# Patient Record
Sex: Female | Born: 1955 | Hispanic: Yes | Marital: Single | Smoking: Never smoker
Health system: Southern US, Community
[De-identification: ages and names within clinical notes are randomized; demographics above are authoritative.]

## PROBLEM LIST (undated history)

## (undated) DIAGNOSIS — E079 Disorder of thyroid, unspecified: Secondary | ICD-10-CM

## (undated) DIAGNOSIS — I10 Essential (primary) hypertension: Secondary | ICD-10-CM

## (undated) HISTORY — PX: HYSTERECTOMY ABDOMINAL WITH SALPINGECTOMY: SHX6725

---

## 2020-02-13 ENCOUNTER — Ambulatory Visit (INDEPENDENT_AMBULATORY_CARE_PROVIDER_SITE_OTHER)
Admission: EM | Admit: 2020-02-13 | Discharge: 2020-02-13 | Disposition: A | Discharge status: Home / Self Care | Payer: 59 | Source: Home / Self Care

## 2020-02-13 ENCOUNTER — Other Ambulatory Visit: Payer: Self-pay

## 2020-02-13 ENCOUNTER — Encounter (HOSPITAL_COMMUNITY): Payer: Self-pay

## 2020-02-13 DIAGNOSIS — Z1152 Encounter for screening for COVID-19: Secondary | ICD-10-CM

## 2020-02-13 DIAGNOSIS — U071 COVID-19: Secondary | ICD-10-CM | POA: Insufficient documentation

## 2020-02-13 HISTORY — DX: Disorder of thyroid, unspecified: E07.9

## 2020-02-13 HISTORY — DX: Essential (primary) hypertension: I10

## 2020-02-13 NOTE — ED Triage Notes (Signed)
Patient needs COVID testing for travel purposes.

## 2020-02-13 NOTE — ED Provider Notes (Signed)
  [facility]   MRN: [id number] DOB: 1956-03-30  Subjective:   Misty Pruitt is a 64 y.o. female presenting for COVID-19 testing.  Patient is traveling to [place].  Denies any symptoms.  No current facility-administered medications for this encounter. No current outpatient medications on file.   No Known Allergies  Past Medical History:  Diagnosis Date  . Hypertension   . Thyroid disease       No family history on file.  Social History   Tobacco Use  . Smoking status: Never Smoker  . Smokeless tobacco: Never Used  Substance Use Topics  . Alcohol use: Not on file  . Drug use: Not on file    Review of Systems  Constitutional: Negative for fever and malaise/fatigue.  HENT: Negative for congestion, ear pain, sinus pain and sore throat.   Eyes: Negative for discharge and redness.  Respiratory: Negative for cough, hemoptysis, shortness of breath and wheezing.   Cardiovascular: Negative for chest pain.  Gastrointestinal: Negative for abdominal pain, diarrhea, nausea and vomiting.  Genitourinary: Negative for dysuria, flank pain and hematuria.  Musculoskeletal: Negative for myalgias.  Skin: Negative for rash.  Neurological: Negative for dizziness, weakness and headaches.  Psychiatric/Behavioral: Negative for depression and substance abuse.     Objective:   Vitals: BP 140/65 (BP Location: Right Arm)   Pulse 93   Temp 97.8 F (36.6 C) (Oral)   Resp 14   SpO2 99%   Physical Exam Constitutional:      General: She is not in acute distress.    Appearance: Normal appearance. She is well-developed. She is not ill-appearing, toxic-appearing or diaphoretic.  HENT:     Head: Normocephalic and atraumatic.     Nose: Nose normal.     Mouth/Throat:     Mouth: Mucous membranes are moist.  Eyes:     Extraocular Movements: Extraocular movements intact.     Pupils: Pupils are equal, round, and reactive to light.  Cardiovascular:     Rate and Rhythm: Normal rate  and regular rhythm.     Pulses: Normal pulses.     Heart sounds: Normal heart sounds. No murmur. No friction rub. No gallop.   Pulmonary:     Effort: Pulmonary effort is normal. No respiratory distress.     Breath sounds: Normal breath sounds. No stridor. No wheezing, rhonchi or rales.  Skin:    General: Skin is warm and dry.     Findings: No rash.  Neurological:     Mental Status: She is alert and oriented to person, place, and time.  Psychiatric:        Mood and Affect: Mood normal.        Behavior: Behavior normal.        Thought Content: Thought content normal.     Assessment and Plan :   1. Encounter for screening for COVID-19     Counseled patient on nature of COVID-19 including modes of transmission, diagnostic testing, management and supportive care.  Counseled on medications used for symptomatic relief. COVID 19 testing is pending. Counseled patient on potential for adverse effects with medications prescribed/recommended today, ER and return-to-clinic precautions discussed, patient verbalized understanding.     Wallis Bamberg, PA-C 02/13/20 1755

## 2020-02-14 LAB — SARS CORONAVIRUS 2 (TAT 6-24 HRS): SARS Coronavirus 2: POSITIVE — AB

## 2020-02-15 ENCOUNTER — Other Ambulatory Visit: Payer: Self-pay

## 2020-02-15 ENCOUNTER — Encounter (HOSPITAL_COMMUNITY): Payer: Self-pay | Admitting: *Deleted

## 2020-02-15 ENCOUNTER — Inpatient Hospital Stay (HOSPITAL_COMMUNITY)
Admission: EM | Admit: 2020-02-15 | Discharge: 2020-02-21 | DRG: 177 | Disposition: A | Discharge status: Home / Self Care | Payer: 59 | Attending: Internal Medicine | Admitting: Internal Medicine

## 2020-02-15 DIAGNOSIS — U071 COVID-19: Principal | ICD-10-CM | POA: Diagnosis present

## 2020-02-15 DIAGNOSIS — Z79890 Hormone replacement therapy (postmenopausal): Secondary | ICD-10-CM

## 2020-02-15 DIAGNOSIS — I10 Essential (primary) hypertension: Secondary | ICD-10-CM | POA: Diagnosis present

## 2020-02-15 DIAGNOSIS — R0902 Hypoxemia: Secondary | ICD-10-CM | POA: Diagnosis not present

## 2020-02-15 DIAGNOSIS — Z79899 Other long term (current) drug therapy: Secondary | ICD-10-CM

## 2020-02-15 DIAGNOSIS — Z7982 Long term (current) use of aspirin: Secondary | ICD-10-CM

## 2020-02-15 DIAGNOSIS — I959 Hypotension, unspecified: Secondary | ICD-10-CM | POA: Diagnosis present

## 2020-02-15 DIAGNOSIS — R197 Diarrhea, unspecified: Secondary | ICD-10-CM | POA: Diagnosis present

## 2020-02-15 DIAGNOSIS — J9600 Acute respiratory failure, unspecified whether with hypoxia or hypercapnia: Secondary | ICD-10-CM | POA: Diagnosis present

## 2020-02-15 DIAGNOSIS — D649 Anemia, unspecified: Secondary | ICD-10-CM | POA: Diagnosis present

## 2020-02-15 DIAGNOSIS — E669 Obesity, unspecified: Secondary | ICD-10-CM | POA: Diagnosis present

## 2020-02-15 DIAGNOSIS — Z6830 Body mass index (BMI) 30.0-30.9, adult: Secondary | ICD-10-CM

## 2020-02-15 DIAGNOSIS — E039 Hypothyroidism, unspecified: Secondary | ICD-10-CM | POA: Diagnosis present

## 2020-02-15 DIAGNOSIS — J1282 Pneumonia due to coronavirus disease 2019: Secondary | ICD-10-CM | POA: Diagnosis present

## 2020-02-15 DIAGNOSIS — J9601 Acute respiratory failure with hypoxia: Secondary | ICD-10-CM | POA: Diagnosis present

## 2020-02-15 LAB — CBC WITH DIFFERENTIAL/PLATELET
Abs Immature Granulocytes: 0.04 10*3/uL (ref 0.00–0.07)
Basophils Absolute: 0 10*3/uL (ref 0.0–0.1)
Basophils Relative: 0 %
Eosinophils Absolute: 0 10*3/uL (ref 0.0–0.5)
Eosinophils Relative: 0 %
HCT: 38.2 % (ref 36.0–46.0)
Hemoglobin: 11.8 g/dL — ABNORMAL LOW (ref 12.0–15.0)
Immature Granulocytes: 0 %
Lymphocytes Relative: 14 %
Lymphs Abs: 1.3 10*3/uL (ref 0.7–4.0)
MCH: 26.3 pg (ref 26.0–34.0)
MCHC: 30.9 g/dL (ref 30.0–36.0)
MCV: 85.1 fL (ref 80.0–100.0)
Monocytes Absolute: 0.7 10*3/uL (ref 0.1–1.0)
Monocytes Relative: 8 %
Neutro Abs: 7.1 10*3/uL (ref 1.7–7.7)
Neutrophils Relative %: 78 %
Platelets: 240 10*3/uL (ref 150–400)
RBC: 4.49 MIL/uL (ref 3.87–5.11)
RDW: 14.6 % (ref 11.5–15.5)
WBC: 9.2 10*3/uL (ref 4.0–10.5)
nRBC: 0 % (ref 0.0–0.2)

## 2020-02-15 MED ORDER — ACETAMINOPHEN 325 MG PO TABS: 650.0000 mg | ORAL_TABLET | Freq: Once | ORAL | Status: DC | PRN

## 2020-02-15 NOTE — ED Triage Notes (Signed)
To ED for further eval of sob and cough. Pt dx with covid 2 wks ago. Pt alert and talking in complete sentences. Temp 102. Last tylenol last pm. Will give some in triage

## 2020-02-16 ENCOUNTER — Emergency Department (HOSPITAL_COMMUNITY): Payer: 59

## 2020-02-16 ENCOUNTER — Other Ambulatory Visit: Payer: Self-pay

## 2020-02-16 ENCOUNTER — Encounter (HOSPITAL_COMMUNITY): Payer: Self-pay | Admitting: Internal Medicine

## 2020-02-16 DIAGNOSIS — I10 Essential (primary) hypertension: Secondary | ICD-10-CM | POA: Diagnosis present

## 2020-02-16 DIAGNOSIS — R0902 Hypoxemia: Secondary | ICD-10-CM | POA: Diagnosis present

## 2020-02-16 DIAGNOSIS — E669 Obesity, unspecified: Secondary | ICD-10-CM | POA: Diagnosis present

## 2020-02-16 DIAGNOSIS — J9600 Acute respiratory failure, unspecified whether with hypoxia or hypercapnia: Secondary | ICD-10-CM | POA: Diagnosis present

## 2020-02-16 DIAGNOSIS — I959 Hypotension, unspecified: Secondary | ICD-10-CM | POA: Diagnosis present

## 2020-02-16 DIAGNOSIS — R197 Diarrhea, unspecified: Secondary | ICD-10-CM | POA: Diagnosis present

## 2020-02-16 DIAGNOSIS — D649 Anemia, unspecified: Secondary | ICD-10-CM | POA: Diagnosis present

## 2020-02-16 DIAGNOSIS — Z7982 Long term (current) use of aspirin: Secondary | ICD-10-CM | POA: Diagnosis not present

## 2020-02-16 DIAGNOSIS — Z79890 Hormone replacement therapy (postmenopausal): Secondary | ICD-10-CM | POA: Diagnosis not present

## 2020-02-16 DIAGNOSIS — J9601 Acute respiratory failure with hypoxia: Secondary | ICD-10-CM | POA: Diagnosis present

## 2020-02-16 DIAGNOSIS — Z79899 Other long term (current) drug therapy: Secondary | ICD-10-CM | POA: Diagnosis not present

## 2020-02-16 DIAGNOSIS — U071 COVID-19: Principal | ICD-10-CM

## 2020-02-16 DIAGNOSIS — E039 Hypothyroidism, unspecified: Secondary | ICD-10-CM | POA: Diagnosis present

## 2020-02-16 DIAGNOSIS — Z6830 Body mass index (BMI) 30.0-30.9, adult: Secondary | ICD-10-CM | POA: Diagnosis not present

## 2020-02-16 DIAGNOSIS — J1282 Pneumonia due to coronavirus disease 2019: Secondary | ICD-10-CM | POA: Diagnosis present

## 2020-02-16 LAB — COMPREHENSIVE METABOLIC PANEL
ALT: 19 U/L (ref 0–44)
AST: 28 U/L (ref 15–41)
Albumin: 3.7 g/dL (ref 3.5–5.0)
Alkaline Phosphatase: 63 U/L (ref 38–126)
Anion gap: 11 (ref 5–15)
BUN: 11 mg/dL (ref 8–23)
CO2: 25 mmol/L (ref 22–32)
Calcium: 8.5 mg/dL — ABNORMAL LOW (ref 8.9–10.3)
Chloride: 99 mmol/L (ref 98–111)
Creatinine, Ser: 0.95 mg/dL (ref 0.44–1.00)
GFR calc Af Amer: >60 mL/min (ref >60)
GFR calc non Af Amer: >60 mL/min (ref >60)
Glucose, Bld: 116 mg/dL — ABNORMAL HIGH (ref 70–99)
Potassium: 3.8 mmol/L (ref 3.5–5.1)
Sodium: 135 mmol/L (ref 135–145)
Total Bilirubin: 0.4 mg/dL (ref 0.3–1.2)
Total Protein: 7.4 g/dL (ref 6.5–8.1)

## 2020-02-16 LAB — CBC
HCT: 36.3 % (ref 36.0–46.0)
Hemoglobin: 11.5 g/dL — ABNORMAL LOW (ref 12.0–15.0)
MCH: 26.9 pg (ref 26.0–34.0)
MCHC: 31.7 g/dL (ref 30.0–36.0)
MCV: 84.8 fL (ref 80.0–100.0)
Platelets: 225 10*3/uL (ref 150–400)
RBC: 4.28 MIL/uL (ref 3.87–5.11)
RDW: 14.8 % (ref 11.5–15.5)
WBC: 8.8 10*3/uL (ref 4.0–10.5)
nRBC: 0 % (ref 0.0–0.2)

## 2020-02-16 LAB — PROCALCITONIN: Procalcitonin: <0.1 ng/mL

## 2020-02-16 LAB — D-DIMER, QUANTITATIVE: D-Dimer, Quant: 0.5 ug/mL-FEU (ref 0.00–0.50)

## 2020-02-16 LAB — C-REACTIVE PROTEIN: CRP: 8.8 mg/dL — ABNORMAL HIGH (ref <1.0)

## 2020-02-16 LAB — CREATININE, SERUM
Creatinine, Ser: 0.8 mg/dL (ref 0.44–1.00)
GFR calc Af Amer: >60 mL/min (ref >60)
GFR calc non Af Amer: >60 mL/min (ref >60)

## 2020-02-16 LAB — FERRITIN: Ferritin: 142 ng/mL (ref 11–307)

## 2020-02-16 LAB — HIV ANTIBODY (ROUTINE TESTING W REFLEX): HIV Screen 4th Generation wRfx: NONREACTIVE

## 2020-02-16 LAB — ABO/RH: ABO/RH(D): AB POS

## 2020-02-16 LAB — TROPONIN I (HIGH SENSITIVITY)
Troponin I (High Sensitivity): 4 ng/L (ref <18)
Troponin I (High Sensitivity): 5 ng/L (ref <18)

## 2020-02-16 MED ORDER — ALBUTEROL SULFATE HFA 108 (90 BASE) MCG/ACT IN AERS
2.0000 | INHALATION_SPRAY | Freq: Once | RESPIRATORY_TRACT | Status: AC
  Administered 2020-02-16: 2 via RESPIRATORY_TRACT
  Filled 2020-02-16: qty 6.7

## 2020-02-16 MED ORDER — LEVOTHYROXINE SODIUM 50 MCG PO TABS
50.0000 ug | ORAL_TABLET | Freq: Every day | ORAL | Status: DC
  Administered 2020-02-16 – 2020-02-21 (×6): 50 ug via ORAL
  Filled 2020-02-16 (×6): qty 1

## 2020-02-16 MED ORDER — ONDANSETRON HCL 4 MG/2ML IJ SOLN: 4.0000 mg | Freq: Four times a day (QID) | INTRAMUSCULAR | Status: DC | PRN

## 2020-02-16 MED ORDER — SODIUM CHLORIDE 0.9 % IV SOLN
100.0000 mg | Freq: Every day | INTRAVENOUS | Status: AC
  Administered 2020-02-17 – 2020-02-20 (×4): 100 mg via INTRAVENOUS
  Filled 2020-02-16 (×4): qty 20

## 2020-02-16 MED ORDER — HYDROCOD POLST-CPM POLST ER 10-8 MG/5ML PO SUER
5.0000 mL | Freq: Once | ORAL | Status: AC
  Administered 2020-02-16: 5 mL via ORAL
  Filled 2020-02-16: qty 5

## 2020-02-16 MED ORDER — SODIUM CHLORIDE 0.9 % IV SOLN
100.0000 mg | INTRAVENOUS | Status: AC
  Administered 2020-02-16: 100 mg via INTRAVENOUS
  Filled 2020-02-16 (×2): qty 20

## 2020-02-16 MED ORDER — HYDROCOD POLST-CPM POLST ER 10-8 MG/5ML PO SUER
5.0000 mL | Freq: Two times a day (BID) | ORAL | Status: DC | PRN
  Administered 2020-02-17 – 2020-02-20 (×5): 5 mL via ORAL
  Filled 2020-02-16 (×6): qty 5

## 2020-02-16 MED ORDER — ONDANSETRON HCL 4 MG PO TABS
4.0000 mg | ORAL_TABLET | Freq: Four times a day (QID) | ORAL | Status: DC | PRN
  Administered 2020-02-19: 4 mg via ORAL
  Filled 2020-02-16: qty 1

## 2020-02-16 MED ORDER — DEXAMETHASONE SODIUM PHOSPHATE 10 MG/ML IJ SOLN
10.0000 mg | Freq: Once | INTRAMUSCULAR | Status: AC
  Administered 2020-02-16: 10 mg via INTRAVENOUS
  Filled 2020-02-16: qty 1

## 2020-02-16 MED ORDER — FAMOTIDINE 20 MG PO TABS
20.0000 mg | ORAL_TABLET | Freq: Every day | ORAL | Status: DC
  Administered 2020-02-16 – 2020-02-20 (×5): 20 mg via ORAL
  Filled 2020-02-16 (×5): qty 1

## 2020-02-16 MED ORDER — DEXAMETHASONE SODIUM PHOSPHATE 10 MG/ML IJ SOLN
6.0000 mg | INTRAMUSCULAR | Status: DC
  Administered 2020-02-17 – 2020-02-20 (×4): 6 mg via INTRAVENOUS
  Filled 2020-02-16 (×4): qty 1

## 2020-02-16 MED ORDER — LISINOPRIL 40 MG PO TABS
40.0000 mg | ORAL_TABLET | Freq: Every day | ORAL | Status: DC
  Administered 2020-02-16 – 2020-02-21 (×6): 40 mg via ORAL
  Filled 2020-02-16: qty 2
  Filled 2020-02-16 (×5): qty 1

## 2020-02-16 MED ORDER — SODIUM CHLORIDE 0.9 % IV SOLN: 100.0000 mg | Freq: Every day | INTRAVENOUS | Status: DC

## 2020-02-16 MED ORDER — SODIUM CHLORIDE 0.9 % IV SOLN: 200.0000 mg | Freq: Once | INTRAVENOUS | Status: DC

## 2020-02-16 MED ORDER — FLUOXETINE HCL 20 MG PO CAPS: 40.0000 mg | ORAL_CAPSULE | Freq: Every day | ORAL | Status: DC | PRN

## 2020-02-16 MED ORDER — ASPIRIN EC 81 MG PO TBEC
81.0000 mg | DELAYED_RELEASE_TABLET | ORAL | Status: DC
  Administered 2020-02-16 – 2020-02-21 (×3): 81 mg via ORAL
  Filled 2020-02-16 (×2): qty 1

## 2020-02-16 MED ORDER — ENOXAPARIN SODIUM 40 MG/0.4ML ~~LOC~~ SOLN
40.0000 mg | Freq: Every day | SUBCUTANEOUS | Status: DC
  Administered 2020-02-16 – 2020-02-21 (×6): 40 mg via SUBCUTANEOUS
  Filled 2020-02-16 (×6): qty 0.4

## 2020-02-16 MED ORDER — GUAIFENESIN-DM 100-10 MG/5ML PO SYRP
5.0000 mL | ORAL_SOLUTION | ORAL | Status: DC | PRN
  Administered 2020-02-16 – 2020-02-20 (×6): 5 mL via ORAL
  Filled 2020-02-16 (×6): qty 5

## 2020-02-16 NOTE — Plan of Care (Signed)
  Problem: Coping: Goal: Psychosocial and spiritual needs will be supported Outcome: Progressing   Problem: Respiratory: Goal: Will maintain a patent airway Outcome: Progressing   Problem: Respiratory: Goal: Complications related to the disease process, condition or treatment will be avoided or minimized Outcome: Progressing   

## 2020-02-16 NOTE — ED Provider Notes (Signed)
[facility] EMERGENCY DEPARTMENT Provider Note   CSN: [id number] Arrival date & time: 02/15/20  0867     History Chief Complaint  Patient presents with  . Cough  . Shortness of Breath    Misty Pruitt is a 64 y.o. female.  Patient to ED with cough, SOB, fever, nausea, body aches with known diagnosis of COVID 02/13/20. She has been able to eat and drink, no decrease in urination. She reports symptoms at the time of COVID test previously were "I felt yucky" but no SOB, known fever or significant cough. She has a history of HTN, Thyroid disease.   The history is provided by the patient. No language interpreter was used.  Cough Associated symptoms: chills, fever, myalgias and shortness of breath   Associated symptoms: no chest pain, no headaches and no sore throat   Shortness of Breath Associated symptoms: cough and fever   Associated symptoms: no abdominal pain, no chest pain, no headaches and no sore throat        Past Medical History:  Diagnosis Date  . Hypertension   . Thyroid disease     There are no problems to display for this patient.   Past Surgical History:  Procedure Laterality Date  . HYSTERECTOMY ABDOMINAL WITH SALPINGECTOMY       OB History   No obstetric history on file.     No family history on file.  Social History   Tobacco Use  . Smoking status: Never Smoker  . Smokeless tobacco: Never Used  Substance Use Topics  . Alcohol use: Not on file  . Drug use: Not on file    Home Medications Prior to Admission medications   Not on File    Allergies    Patient has no known allergies.  Review of Systems   Review of Systems  Constitutional: Positive for chills, fatigue and fever.  HENT: Positive for congestion. Negative for sore throat.   Respiratory: Positive for cough and shortness of breath.   Cardiovascular: Negative for chest pain.  Gastrointestinal: Negative for abdominal pain and nausea.  Genitourinary: Negative.     Musculoskeletal: Positive for myalgias.  Neurological: Negative for headaches.    Physical Exam Updated Vital Signs BP 118/75   Pulse 80   Temp 99.4 F (37.4 C) (Oral)   Resp 20   Ht 5\' 5"  (1.651 m)   Wt 83.5 kg   SpO2 98%   BMI 30.62 kg/m   Physical Exam Vitals and nursing note reviewed.  Constitutional:      Appearance: She is well-developed.  HENT:     Head: Normocephalic.  Cardiovascular:     Rate and Rhythm: Normal rate and regular rhythm.     Heart sounds: No murmur.  Pulmonary:     Effort: Pulmonary effort is normal.     Breath sounds: Examination of the right-lower field reveals rales. Examination of the left-lower field reveals rales. Rales present. No wheezing or rhonchi.  Abdominal:     General: Bowel sounds are normal.     Palpations: Abdomen is soft.     Tenderness: There is no abdominal tenderness. There is no guarding or rebound.  Musculoskeletal:        General: Normal range of motion.     Cervical back: Normal range of motion and neck supple.     Right lower leg: No edema.     Left lower leg: No edema.  Skin:    General: Skin is warm and dry.  Findings: No rash.  Neurological:     Mental Status: She is alert and oriented to person, place, and time.     ED Results / Procedures / Treatments   Labs (all labs ordered are listed, but only abnormal results are displayed) Labs Reviewed  CBC WITH DIFFERENTIAL/PLATELET - Abnormal; Notable for the following components:      Result Value   Hemoglobin 11.8 (*)    All other components within normal limits  COMPREHENSIVE METABOLIC PANEL    EKG None  Radiology No results found.  Procedures Procedures (including critical care time)  Medications Ordered in ED Medications  acetaminophen (TYLENOL) tablet 650 mg (has no administration in time range)  albuterol (VENTOLIN HFA) 108 (90 Base) MCG/ACT inhaler 2 puff (has no administration in time range)  chlorpheniramine-HYDROcodone (TUSSIONEX) 10-8  MG/5ML suspension 5 mL (has no administration in time range)    ED Course  I have reviewed the triage vital signs and the nursing notes.  Pertinent labs & imaging results that were available during my care of the patient were reviewed by me and considered in my medical decision making (see chart for details).    MDM Rules/Calculators/A&P                      Patient to ED with symptoms of SOB, fever, fatigue that started today. Dx COVID 02/13/20.  She is overall well appearing. She is actively coughing on exam. Albuterol inhaler and Tussionex provided with improvement in cough. CXR c/w COVID. She ambulates with normal O2 saturation, however, she becomes diaphoretic, lightheaded and coughing recurs. Recheck blood pressure 68 systolic.   On re-exam, monitored oxygenation is between 88-91%. Decadron ordered. Patient is felt to require admission for further management/treatment.   Final Clinical Impression(s) / ED Diagnoses Final diagnoses:  None   1. COVID 2. Hypotension 3. Hypoxia   Rx / DC Orders ED Discharge Orders    None       Elpidio Anis, PA-C 02/16/20 0241    Ward, Layla Maw, DO 02/16/20 [phone]

## 2020-02-16 NOTE — ED Notes (Signed)
Tele

## 2020-02-16 NOTE — ED Notes (Signed)
Admitting at bedside 

## 2020-02-16 NOTE — ED Notes (Signed)
Pt assisted to and from commode.

## 2020-02-16 NOTE — ED Notes (Signed)
PT states she feels better and wants to go home. I contacted the admitting MD, Dr. Randol Kern. He advised to ambulate the patient and monitor her oxygen saturation.

## 2020-02-16 NOTE — Progress Notes (Deleted)
Patient scheduled for outpatient Remdesivir infusion at 10:00 AM Saturday 4/10, Sunday 4/11, Monday 4/12 and Tuesday 4/13.  Please advise them to report to [facility] at [address]  Drive to the security guard and tell them you are here for an infusion. They will direct you to the front entrance where we will come and get you.  For questions call [phone].  Thanks

## 2020-02-16 NOTE — ED Notes (Addendum)
After ambulating, Pt becoming diaphoretic, dizzy, and hypotensive while resting on cart. Desatting to 86%. Placed on 2L [place] with improved sats to 94%. Manual BP 75/59. PA aware

## 2020-02-16 NOTE — ED Notes (Signed)
Ambulated patient on room air, she ambulated well, but after sitting back down her o2 sat dropped to 88%. Upon showing her the nu,bers, the patient agreed to stay. Dr Randol Kern made aware.

## 2020-02-16 NOTE — ED Notes (Signed)
Breakfast Ordered 

## 2020-02-16 NOTE — H&P (Addendum)
History and Physical    Misty Pruitt IFO:[id number] DOB: 01-Jul-1956 DOA: 02/15/2020  PCP: Pearson Grippe, MD  Patient coming from: Home.  Chief Complaint: Fatigue, weakness and shortness of breath.  HPI: Misty Pruitt is a 63 y.o. female with history of hypertension hypothyroidism presents to the ER with complaints of ongoing fatigue weakness diarrhea shortness of breath nonproductive cough subjective feeling of fever and chills which has been ongoing for last 2 weeks.  Patient states multiple family members are Covid positive at home.  ED Course: In the ER patient is hypoxic requiring 2 L oxygen.  Chest x-ray shows bilateral infiltrates.  Covid test is positive.  Labs show hemoglobin of 11.8 otherwise largely unremarkable.  Inflammatory markers are pending.  Patient admitted for further management of Covid infection.  Review of Systems: As per HPI, rest all negative.   Past Medical History:  Diagnosis Date  . Hypertension   . Thyroid disease     Past Surgical History:  Procedure Laterality Date  . HYSTERECTOMY ABDOMINAL WITH SALPINGECTOMY       reports that she has never smoked. She has never used smokeless tobacco. No history on file for alcohol and drug.  No Known Allergies  Family History  Problem Relation Age of Onset  . Diabetes Mellitus II Neg Hx     Prior to Admission medications   Medication Sig Start Date End Date Taking? Authorizing Provider  aspirin EC 81 MG tablet Take 81 mg by mouth 3 (three) times a week.   Yes [provider]  b complex vitamins tablet Take 1 tablet by mouth daily with breakfast.   Yes [provider]  CALCIUM PO Take 1 tablet by mouth daily with breakfast.   Yes [provider]  cetirizine (ZYRTEC) 10 MG tablet Take 10 mg by mouth daily as needed for allergies or rhinitis (or "seasonal allergies").   Yes [provider]  Cholecalciferol (VITAMIN D-3 PO) Take 1 capsule by mouth daily with breakfast.   Yes  [provider]  FLUoxetine (PROZAC) 40 MG capsule Take 40 mg by mouth daily as needed (for mood).  12/12/19  Yes [provider]  fluticasone (FLONASE) 50 MCG/ACT nasal spray Place 1-2 sprays into both nostrils daily as needed for allergies or rhinitis (or "seasonal allergies").   Yes [provider]  levothyroxine (SYNTHROID) 50 MCG tablet Take 50 mcg by mouth daily before breakfast.   Yes [provider]  lisinopril (ZESTRIL) 40 MG tablet Take 40 mg by mouth daily. 12/12/19  Yes [provider]  multivitamin-lutein (OCUVITE-LUTEIN) CAPS capsule Take 1 capsule by mouth daily.   Yes [provider]  VITAMIN E PO Take 1 capsule by mouth daily with breakfast.   Yes [provider]    Physical Exam: Constitutional: Moderately built and nourished. Vitals:   02/16/20 0400 02/16/20 0430 02/16/20 0445 02/16/20 0446  BP: (!) 101/53 (!) 112/53 107/70 (!) 112/53  Pulse: 63 64 65 66  Resp: 18 19 20 18   Temp:    99.3 F (37.4 C)  TempSrc:    Oral  SpO2: 96% 91% 94% 95%  Weight:      Height:       Eyes: Anicteric no pallor. ENMT: No discharge from the ears eyes nose or mouth. Neck: No mass felt.  No neck rigidity. Respiratory: No rhonchi or crepitations. Cardiovascular: S1-S2 heard. Abdomen: Soft nontender bowel sounds present. Musculoskeletal: No edema. Skin: No rash. Neurologic: Alert awake oriented to time  place and person.  Moves all extremities. Psychiatric: Appears normal per normal affect.   Labs on Admission: I have personally reviewed following labs and imaging studies  CBC: Recent Labs  Lab 02/15/20 1639  WBC 9.2  NEUTROABS 7.1  HGB 11.8*  HCT 38.2  MCV 85.1  PLT 240   Basic Metabolic Panel: Recent Labs  Lab 02/16/20 0004  NA 135  K 3.8  CL 99  CO2 25  GLUCOSE 116*  BUN 11  CREATININE 0.95  CALCIUM 8.5*   GFR: Estimated Creatinine Clearance: 65.5 mL/min (by C-G formula based on SCr of 0.95  mg/dL). Liver Function Tests: Recent Labs  Lab 02/16/20 0004  AST 28  ALT 19  ALKPHOS 63  BILITOT 0.4  PROT 7.4  ALBUMIN 3.7   No results for input(s): LIPASE, AMYLASE in the last 168 hours. No results for input(s): AMMONIA in the last 168 hours. Coagulation Profile: No results for input(s): INR, PROTIME in the last 168 hours. Cardiac Enzymes: No results for input(s): CKTOTAL, CKMB, CKMBINDEX, TROPONINI in the last 168 hours. BNP (last 3 results) No results for input(s): PROBNP in the last 8760 hours. HbA1C: No results for input(s): HGBA1C in the last 72 hours. CBG: No results for input(s): GLUCAP in the last 168 hours. Lipid Profile: No results for input(s): CHOL, HDL, LDLCALC, TRIG, CHOLHDL, LDLDIRECT in the last 72 hours. Thyroid Function Tests: No results for input(s): TSH, T4TOTAL, FREET4, T3FREE, THYROIDAB in the last 72 hours. Anemia Panel: No results for input(s): VITAMINB12, FOLATE, FERRITIN, TIBC, IRON, RETICCTPCT in the last 72 hours. Urine analysis: No results found for: COLORURINE, APPEARANCEUR, LABSPEC, PHURINE, GLUCOSEU, HGBUR, BILIRUBINUR, KETONESUR, PROTEINUR, UROBILINOGEN, NITRITE, LEUKOCYTESUR Sepsis Labs: @LABRCNTIP (procalcitonin:4,lacticidven:4) ) Recent Results (from the past 240 hour(s))  SARS CORONAVIRUS 2 (TAT 6-24 HRS) Nasopharyngeal Nasopharyngeal Swab     Status: Abnormal   Collection Time: 02/13/20  4:08 PM   Specimen: Nasopharyngeal Swab  Result Value Ref Range Status   SARS Coronavirus 2 POSITIVE (A) NEGATIVE Final    Comment: E MAIL M BROWNING @0626  02/14/20 BY S GEZAHEGN (NOTE) SARS-CoV-2 target nucleic acids are DETECTED. The SARS-CoV-2 RNA is generally detectable in upper and lower respiratory specimens during the acute phase of infection. Positive results are indicative of the presence of SARS-CoV-2 RNA. Clinical correlation with patient history and other diagnostic information is  necessary to determine patient infection status.  Positive results do not rule out bacterial infection or co-infection with other viruses.  The expected result is Negative. Fact Sheet for Patients: Fact Sheet for Healthcare Providers: 04/15/20 This test is not yet approved or cleared by the [employer] FDA and  has been authorized for detection and/or diagnosis of SARS-CoV-2 by FDA under an Emergency Use Authorization (EUA). This EUA will remain  in effect (meaning this test can be used) for the duration of the COVID-19 declaration und er Section 564(b)(1) of the Act, 21 U.S.C. section 360bbb-3(b)(1), unless the authorization is terminated or revoked sooner. Performed at [facility], [address], [place], [address] [place]      Radiological Exams on Admission: DG Chest Portable 1 View  Result Date: 02/16/2020 CLINICAL DATA:  Cough and shortness of breath. EXAM: PORTABLE CHEST 1 VIEW COMPARISON:  None. FINDINGS: Mild infiltrates are seen within bilateral lung bases. There is no evidence of a pleural effusion or pneumothorax. The heart size and mediastinal contours are within normal limits. The visualized skeletal structures are unremarkable. IMPRESSION: Mild bibasilar infiltrates. Electronically Signed  By: Virgina Norfolk M.D.   On: 02/16/2020 00:26    EKG: Independently reviewed.  Normal sinus rhythm.  Assessment/Plan Principal Problem:   Acute respiratory failure due to COVID-19 [facility]) Active Problems:   Essential hypertension   Hypothyroidism    1. Acute respiratory failure with hypoxia secondary to COVID-19 infection for which I have started patient on remdesivir and Decadron.  Check inflammatory markers.  Closely observe. 2. Hypertension on lisinopril. 3. Hypothyroidism on Synthroid. 4. Normocytic normochromic no old labs to compare follow CBC.  Given that patient has acute hypoxic respiratory failure secondary to Covid infection with  fatigue and weakness will need close monitoring for any further deterioration in inpatient status.   DVT prophylaxis: Lovenox. Code Status: Full code. Family Communication: Discussed with patient. Disposition Plan: Home. Consults called: None. Admission status: Inpatient.   Rise Patience MD [facility] Pager [phone].  If 7PM-7AM, please contact night-coverage [email or web address] Password [facility]  02/16/2020, 5:24 AM

## 2020-02-16 NOTE — ED Notes (Signed)
EDP at bedside  

## 2020-02-16 NOTE — ED Notes (Signed)
Pt ambulatory in room on continuous pulse ox. Pt's sats remained between 92-96% on RA. PA updated

## 2020-02-16 NOTE — Progress Notes (Signed)
PROGRESS NOTE                                                                                                                                                                                                             Patient Demographics:    Misty Pruitt, is a 64 y.o. female, DOB - 1956-11-03, RWE:[id number]  Admit date - 02/15/2020   Admitting Physician Eduard Clos, MD  Outpatient Primary MD for the patient is Pearson Grippe, MD  LOS - 0   Chief Complaint  Patient presents with  . Cough  . Shortness of Breath       Brief Narrative    Patient was seen and admitted earlier today by Dr. Toniann Fail, chart, imaging and labs were reviewed.  HPI: Misty Pruitt is a 64 y.o. female with history of hypertension hypothyroidism presents to the ER with complaints of ongoing fatigue weakness diarrhea shortness of breath nonproductive cough subjective feeling of fever and chills which has been ongoing for last 2 weeks.  Patient states multiple family members are Covid positive at home.  ED Course: In the ER patient is hypoxic requiring 2 L oxygen.  Chest x-ray shows bilateral infiltrates.  Covid test is positive.  Labs show hemoglobin of 11.8 otherwise largely unremarkable.  Inflammatory markers are pending.  Patient admitted for further management of Covid infection.   Subjective:    Misty Pruitt today ports some dyspnea, mainly on exertion, her oxygen saturation dropped to 88% on room air with activity, still reports some cough, T-max of 102.  .   Assessment  & Plan :    Principal Problem:   Acute respiratory failure due to COVID-19 [facility]) Active Problems:   Essential hypertension   Hypothyroidism  Acute respiratory failure with hypoxia secondary to COVID-19 infection -Stress significant for opacity suspicious for COVID-19 pneumonia, she reports some dyspnea, cough, desaturated to 88% with activity, continue with oxygen as needed, she was  encouraged use incentive spirometry, flutter valve, continue with steroids and remedsivir -Continue to trend inflammatory markers .  Hypertension on lisinopril.  Hypothyroidism on Synthroid.  Normocytic normochromic no old labs to compare follow CBC.   COVID-19 Labs  Recent Labs    02/16/20 0530 02/16/20 0830  DDIMER  --  0.50  FERRITIN 142  --   CRP 8.8*  --  Lab Results  Component Value Date   SARSCOV2NAA POSITIVE (A) 02/13/2020     Code Status : Full  Disposition Plan  : Home  Barriers For Discharge : Desaturate with activity, remains on IV steroids and remdesivir  Consults  :  None  Procedures  : None  DVT Prophylaxis  :  Simms lovenox  Lab Results  Component Value Date   PLT 225 02/16/2020    Antibiotics  :    Anti-infectives (From admission, onward)   Start     Dose/Rate Route Frequency Ordered Stop   02/17/20 1000  remdesivir 100 mg in sodium chloride 0.9 % 100 mL IVPB  Status:  Discontinued     100 mg 200 mL/hr over 30 Minutes Intravenous Daily 02/16/20 0526 02/16/20 0535   02/17/20 1000  remdesivir 100 mg in sodium chloride 0.9 % 100 mL IVPB     100 mg 200 mL/hr over 30 Minutes Intravenous Daily 02/16/20 0536 02/21/20 0959   02/16/20 0600  remdesivir 100 mg in sodium chloride 0.9 % 100 mL IVPB     100 mg 200 mL/hr over 30 Minutes Intravenous Every 30 min 02/16/20 0536 02/16/20 0705   02/16/20 0530  remdesivir 200 mg in sodium chloride 0.9% 250 mL IVPB  Status:  Discontinued     200 mg 580 mL/hr over 30 Minutes Intravenous Once 02/16/20 0526 02/16/20 0535        Objective:   Vitals:   02/16/20 1000 02/16/20 1100 02/16/20 1200 02/16/20 1400  BP: (!) 102/53 95/63 (!) 106/53 122/65  Pulse: 68 61 71 70  Resp: (!) 22 20 19  (!) 22  Temp:      TempSrc:      SpO2: 93% 92% 94% 95%  Weight:      Height:        Wt Readings from Last 3 Encounters:  02/15/20 83.5 kg     Intake/Output Summary (Last 24 hours) at 02/16/2020 1634 Last data filed  at 02/16/2020 0723 Gross per 24 hour  Intake 100 ml  Output -  Net 100 ml     Physical Exam  Awake Alert, Oriented X 3, No new F.N deficits, Normal affect Symmetrical Chest wall movement  Abd Soft, No tenderness. No Cyanosis, Clubbing or edema.    Data Review:    CBC Recent Labs  Lab 02/15/20 1639 02/16/20 0530  WBC 9.2 8.8  HGB 11.8* 11.5*  HCT 38.2 36.3  PLT 240 225  MCV 85.1 84.8  MCH 26.3 26.9  MCHC 30.9 31.7  RDW 14.6 14.8  LYMPHSABS 1.3  --   MONOABS 0.7  --   EOSABS 0.0  --   BASOSABS 0.0  --     Chemistries  Recent Labs  Lab 02/16/20 0004 02/16/20 0530  NA 135  --   K 3.8  --   CL 99  --   CO2 25  --   GLUCOSE 116*  --   BUN 11  --   CREATININE 0.95 0.80  CALCIUM 8.5*  --   AST 28  --   ALT 19  --   ALKPHOS 63  --   BILITOT 0.4  --    ------------------------------------------------------------------------------------------------------------------ No results for input(s): CHOL, HDL, LDLCALC, TRIG, CHOLHDL, LDLDIRECT in the last 72 hours.  No results found for: HGBA1C ------------------------------------------------------------------------------------------------------------------ No results for input(s): TSH, T4TOTAL, T3FREE, THYROIDAB in the last 72 hours.  Invalid input(s): FREET3 ------------------------------------------------------------------------------------------------------------------ Recent Labs    02/16/20 0530  FERRITIN 142  Coagulation profile No results for input(s): INR, PROTIME in the last 168 hours.  Recent Labs    02/16/20 0830  DDIMER 0.50    Cardiac Enzymes No results for input(s): CKMB, TROPONINI, MYOGLOBIN in the last 168 hours.  Invalid input(s): CK ------------------------------------------------------------------------------------------------------------------ No results found for: BNP  Inpatient Medications  Scheduled Meds: . aspirin EC  81 mg Oral Once per day on Mon Wed Fri  . [START ON  02/17/2020] dexamethasone (DECADRON) injection  6 mg Intravenous Q24H  . enoxaparin (LOVENOX) injection  40 mg Subcutaneous Daily  . levothyroxine  50 mcg Oral QAC breakfast  . lisinopril  40 mg Oral Daily   Continuous Infusions: . [START ON 02/17/2020] remdesivir 100 mg in NS 100 mL     PRN Meds:.acetaminophen, chlorpheniramine-HYDROcodone, FLUoxetine, guaiFENesin-dextromethorphan, ondansetron **OR** ondansetron (ZOFRAN) IV  Micro Results Recent Results (from the past 240 hour(s))  SARS CORONAVIRUS 2 (TAT 6-24 HRS) Nasopharyngeal Nasopharyngeal Swab     Status: Abnormal   Collection Time: 02/13/20  4:08 PM   Specimen: Nasopharyngeal Swab  Result Value Ref Range Status   SARS Coronavirus 2 POSITIVE (A) NEGATIVE Final    Comment: E MAIL M BROWNING @0626  02/14/20 BY S GEZAHEGN (NOTE) SARS-CoV-2 target nucleic acids are DETECTED. The SARS-CoV-2 RNA is generally detectable in upper and lower respiratory specimens during the acute phase of infection. Positive results are indicative of the presence of SARS-CoV-2 RNA. Clinical correlation with patient history and other diagnostic information is  necessary to determine patient infection status. Positive results do not rule out bacterial infection or co-infection with other viruses.  The expected result is Negative. Fact Sheet for Patients: 04/15/20 Fact Sheet for Healthcare Providers: HairSlick.no This test is not yet approved or cleared by the quierodirigir.com FDA and  has been authorized for detection and/or diagnosis of SARS-CoV-2 by FDA under an Emergency Use Authorization (EUA). This EUA will remain  in effect (meaning this test can be used) for the duration of the COVID-19 declaration und er Section 564(b)(1) of the Act, 21 U.S.C. section 360bbb-3(b)(1), unless the authorization is terminated or revoked sooner. Performed at [facility], [address] [place],  [place]     Radiology Reports DG Chest Portable 1 View  Result Date: 02/16/2020 CLINICAL DATA:  Cough and shortness of breath. EXAM: PORTABLE CHEST 1 VIEW COMPARISON:  None. FINDINGS: Mild infiltrates are seen within bilateral lung bases. There is no evidence of a pleural effusion or pneumothorax. The heart size and mediastinal contours are within normal limits. The visualized skeletal structures are unremarkable. IMPRESSION: Mild bibasilar infiltrates. Electronically Signed   By: 04/17/2020 M.D.   On: 02/16/2020 00:26     04/17/2020 M.D on 02/16/2020 at 4:34 PM  Between 7am to 7pm - Pager - [phone]  After 7pm go to [email or web address] - password [facility]  [facility] -  Office  [phone]

## 2020-02-16 NOTE — ED Notes (Signed)
Decadron given per MAR. Name/DOB verified with pt

## 2020-02-16 NOTE — ED Notes (Signed)
Report given to Jennifer, RN

## 2020-02-16 NOTE — Discharge Instructions (Addendum)
Person Under Monitoring Name: Misty Pruitt  Location: [place] [id number]   Infection Prevention Recommendations for Individuals Confirmed to have, or Being Evaluated for, 2019 Novel Coronavirus (COVID-19) Infection Who Receive Care at Home  Individuals who are confirmed to have, or are being evaluated for, COVID-19 should follow the prevention steps below until a healthcare provider or local or state health department says they can return to normal activities.  Stay home except to get medical care You should restrict activities outside your home, except for getting medical care. Do not go to work, school, or public areas, and do not use public transportation or taxis.  Call ahead before visiting your doctor Before your medical appointment, call the healthcare provider and tell them that you have, or are being evaluated for, COVID-19 infection. This will help the healthcare provider's office take steps to keep other people from getting infected. Ask your healthcare provider to call the local or state health department.  Monitor your symptoms Seek prompt medical attention if your illness is worsening (e.g., difficulty breathing). Before going to your medical appointment, call the healthcare provider and tell them that you have, or are being evaluated for, COVID-19 infection. Ask your healthcare provider to call the local or state health department.  Wear a facemask You should wear a facemask that covers your nose and mouth when you are in the same room with other people and when you visit a healthcare provider. People who live with or visit you should also wear a facemask while they are in the same room with you.  Separate yourself from other people in your home As much as possible, you should stay in a different room from other people in your home. Also, you should use a separate bathroom, if available.  Avoid sharing household items You should not share  dishes, drinking glasses, cups, eating utensils, towels, bedding, or other items with other people in your home. After using these items, you should wash them thoroughly with soap and water.  Cover your coughs and sneezes Cover your mouth and nose with a tissue when you cough or sneeze, or you can cough or sneeze into your sleeve. Throw used tissues in a lined trash can, and immediately wash your hands with soap and water for at least 20 seconds or use an alcohol-based hand rub.  Wash your [facility] your hands often and thoroughly with soap and water for at least 20 seconds. You can use an alcohol-based hand sanitizer if soap and water are not available and if your hands are not visibly dirty. Avoid touching your eyes, nose, and mouth with unwashed hands.   Prevention Steps for Caregivers and Household Members of Individuals Confirmed to have, or Being Evaluated for, COVID-19 Infection Being Cared for in the Home  If you live with, or provide care at home for, a person confirmed to have, or being evaluated for, COVID-19 infection please follow these guidelines to prevent infection:  Follow healthcare provider's instructions Make sure that you understand and can help the patient follow any healthcare provider instructions for all care.  Provide for the patient's basic needs You should help the patient with basic needs in the home and provide support for getting groceries, prescriptions, and other personal needs.  Monitor the patient's symptoms If they are getting sicker, call his or her medical provider and tell them that the patient has, or is being evaluated for, COVID-19 infection. This will help the healthcare provider's office  take steps to keep other people from getting infected. Ask the healthcare provider to call the local or state health department.  Limit the number of people who have contact with the patient  If possible, have only one caregiver for the patient.  Other  household members should stay in another home or place of residence. If this is not possible, they should stay  in another room, or be separated from the patient as much as possible. Use a separate bathroom, if available.  Restrict visitors who do not have an essential need to be in the home.  Keep older adults, very young children, and other sick people away from the patient Keep older adults, very young children, and those who have compromised immune systems or chronic health conditions away from the patient. This includes people with chronic heart, lung, or kidney conditions, diabetes, and cancer.  Ensure good ventilation Make sure that shared spaces in the home have good air flow, such as from an air conditioner or an opened window, weather permitting.  Wash your hands often  Wash your hands often and thoroughly with soap and water for at least 20 seconds. You can use an alcohol based hand sanitizer if soap and water are not available and if your hands are not visibly dirty.  Avoid touching your eyes, nose, and mouth with unwashed hands.  Use disposable paper towels to dry your hands. If not available, use dedicated cloth towels and replace them when they become wet.  Wear a facemask and gloves  Wear a disposable facemask at all times in the room and gloves when you touch or have contact with the patient's blood, body fluids, and/or secretions or excretions, such as sweat, saliva, sputum, nasal mucus, vomit, urine, or feces.  Ensure the mask fits over your nose and mouth tightly, and do not touch it during use.  Throw out disposable facemasks and gloves after using them. Do not reuse.  Wash your hands immediately after removing your facemask and gloves.  If your personal clothing becomes contaminated, carefully remove clothing and launder. Wash your hands after handling contaminated clothing.  Place all used disposable facemasks, gloves, and other waste in a lined container before  disposing them with other household waste.  Remove gloves and wash your hands immediately after handling these items.  Do not share dishes, glasses, or other household items with the patient  Avoid sharing household items. You should not share dishes, drinking glasses, cups, eating utensils, towels, bedding, or other items with a patient who is confirmed to have, or being evaluated for, COVID-19 infection.  After the person uses these items, you should wash them thoroughly with soap and water.  Wash laundry thoroughly  Immediately remove and wash clothes or bedding that have blood, body fluids, and/or secretions or excretions, such as sweat, saliva, sputum, nasal mucus, vomit, urine, or feces, on them.  Wear gloves when handling laundry from the patient.  Read and follow directions on labels of laundry or clothing items and detergent. In general, wash and dry with the warmest temperatures recommended on the label.  Clean all areas the individual has used often  Clean all touchable surfaces, such as counters, tabletops, doorknobs, bathroom fixtures, toilets, phones, keyboards, tablets, and bedside tables, every day. Also, clean any surfaces that may have blood, body fluids, and/or secretions or excretions on them.  Wear gloves when cleaning surfaces the patient has come in contact with.  Use a diluted bleach solution (e.g., dilute bleach with 1 part  bleach and 10 parts water) or a household disinfectant with a label that says EPA-registered for coronaviruses. To make a bleach solution at home, add 1 tablespoon of bleach to 1 quart (4 cups) of water. For a larger supply, add  cup of bleach to 1 gallon (16 cups) of water.  Read labels of cleaning products and follow recommendations provided on product labels. Labels contain instructions for safe and effective use of the cleaning product including precautions you should take when applying the product, such as wearing gloves or eye protection  and making sure you have good ventilation during use of the product.  Remove gloves and wash hands immediately after cleaning.  Monitor yourself for signs and symptoms of illness Caregivers and household members are considered close contacts, should monitor their health, and will be asked to limit movement outside of the home to the extent possible. Follow the monitoring steps for close contacts listed on the symptom monitoring form.   ? If you have additional questions, contact your local health department or call the epidemiologist on call at [phone] (available 24/7). ? This guidance is subject to change. For the most up-to-date guidance from [facility], please refer to their website: TripMetro.hu

## 2020-02-17 ENCOUNTER — Ambulatory Visit (HOSPITAL_COMMUNITY): Payer: 59

## 2020-02-17 LAB — COMPREHENSIVE METABOLIC PANEL
ALT: 24 U/L (ref 0–44)
AST: 29 U/L (ref 15–41)
Albumin: 3.3 g/dL — ABNORMAL LOW (ref 3.5–5.0)
Alkaline Phosphatase: 58 U/L (ref 38–126)
Anion gap: 12 (ref 5–15)
BUN: 15 mg/dL (ref 8–23)
CO2: 27 mmol/L (ref 22–32)
Calcium: 8.6 mg/dL — ABNORMAL LOW (ref 8.9–10.3)
Chloride: 99 mmol/L (ref 98–111)
Creatinine, Ser: 0.77 mg/dL (ref 0.44–1.00)
GFR calc Af Amer: >60 mL/min (ref >60)
GFR calc non Af Amer: >60 mL/min (ref >60)
Glucose, Bld: 109 mg/dL — ABNORMAL HIGH (ref 70–99)
Potassium: 3.6 mmol/L (ref 3.5–5.1)
Sodium: 138 mmol/L (ref 135–145)
Total Bilirubin: 0.2 mg/dL — ABNORMAL LOW (ref 0.3–1.2)
Total Protein: 7.4 g/dL (ref 6.5–8.1)

## 2020-02-17 LAB — CBC
HCT: 37 % (ref 36.0–46.0)
Hemoglobin: 11.7 g/dL — ABNORMAL LOW (ref 12.0–15.0)
MCH: 26.7 pg (ref 26.0–34.0)
MCHC: 31.6 g/dL (ref 30.0–36.0)
MCV: 84.5 fL (ref 80.0–100.0)
Platelets: 270 10*3/uL (ref 150–400)
RBC: 4.38 MIL/uL (ref 3.87–5.11)
RDW: 14.6 % (ref 11.5–15.5)
WBC: 12.4 10*3/uL — ABNORMAL HIGH (ref 4.0–10.5)
nRBC: 0 % (ref 0.0–0.2)

## 2020-02-17 LAB — D-DIMER, QUANTITATIVE: D-Dimer, Quant: 0.54 ug/mL-FEU — ABNORMAL HIGH (ref 0.00–0.50)

## 2020-02-17 LAB — C-REACTIVE PROTEIN: CRP: 6.5 mg/dL — ABNORMAL HIGH (ref <1.0)

## 2020-02-17 NOTE — Progress Notes (Signed)
PROGRESS NOTE                                                                                                                                                                                                             Patient Demographics:    Misty Pruitt, is a 64 y.o. female, DOB - [birth year], [id number]:[id number]  Admit date - 02/15/2020   Admitting Physician Eduard Clos, MD  Outpatient Primary MD for the patient is Pearson Grippe, MD  LOS - 1   Chief Complaint  Patient presents with  . Cough  . Shortness of Breath       Brief Narrative    Misty Pruitt is a 64 y.o. female with history of hypertension hypothyroidism presents to the ER with complaints of ongoing fatigue weakness diarrhea shortness of breath nonproductive cough subjective feeling of fever and chills which has been ongoing for last 2 weeks.  Patient states multiple family members are Covid positive at home.    Subjective:    Misty Pruitt today reports some dyspnea, cough and congestion, denies any chest pain .   Assessment  & Plan :    Principal Problem:   Acute respiratory failure due to COVID-19 [facility]) Active Problems:   Essential hypertension   Hypothyroidism  Acute respiratory failure with hypoxia secondary to COVID-19 infection - Chest x-ray significant for opacity suspicious for COVID-19 pneumonia, she reports some dyspnea, cough, morning she is saturating 90 to 91% on 2 L nasal cannula , he was encouraged to use incentive spirometry, get out of bed to chair . - continue with steroids and remedsivir -Continue to trend inflammatory markers .  Hypertension  - on lisinopril.  Hypothyroidism - on Synthroid.  Normocytic normochromic no old labs to compare follow CBC.   COVID-19 Labs  Recent Labs    02/16/20 0530 02/16/20 0830 02/17/20 0324  DDIMER  --  0.50 0.54*  FERRITIN 142  --   --   CRP 8.8*  --  6.5*    Lab Results  Component Value Date   SARSCOV2NAA POSITIVE (A) 02/13/2020     Code Status : Full  Disposition Plan  : Home  Barriers For Discharge :  remains on IV steroids and remdesivir  Consults  :  None  Procedures  : None  DVT Prophylaxis  :   lovenox  Lab Results  Component Value Date   PLT 270 02/17/2020    Antibiotics  :    Anti-infectives (From admission, onward)   Start     Dose/Rate Route Frequency Ordered Stop   02/17/20 1000  remdesivir 100 mg in sodium chloride 0.9 % 100 mL IVPB  Status:  Discontinued     100 mg 200 mL/hr over 30 Minutes Intravenous Daily 02/16/20 0526 02/16/20 0535   02/17/20 1000  remdesivir 100 mg in sodium chloride 0.9 % 100 mL IVPB     100 mg 200 mL/hr over 30 Minutes Intravenous Daily 02/16/20 0536 02/21/20 0959   02/16/20 0600  remdesivir 100 mg in sodium chloride 0.9 % 100 mL IVPB     100 mg 200 mL/hr over 30 Minutes Intravenous Every 30 min 02/16/20 0536 02/16/20 0705   02/16/20 0530  remdesivir 200 mg in sodium chloride 0.9% 250 mL IVPB  Status:  Discontinued     200 mg 580 mL/hr over 30 Minutes Intravenous Once 02/16/20 0526 02/16/20 0535        Objective:   Vitals:   02/16/20 1715 02/16/20 1808 02/16/20 2000 02/17/20 0505  BP: (!) 142/77 115/63 129/68 (!) 119/58  Pulse: 77 80 82 68  Resp: (!) 23 (!) 23 16 20   Temp:  98.3 F (36.8 C) 98.6 F (37 C) 98.3 F (36.8 C)  TempSrc:  Oral Oral Oral  SpO2: 100% (!) 88% 91% 91%  Weight:      Height:        Wt Readings from Last 3 Encounters:  02/15/20 83.5 kg    No intake or output data in the 24 hours ending 02/17/20 1249   Physical Exam  Awake Alert, Oriented X 3, No new F.N deficits, Normal affect Symmetrical Chest wall movement, Good air movement bilaterally, CTAB RRR,No Gallops,Rubs or new Murmurs, No Parasternal Heave +ve B.Sounds, Abd Soft, No tenderness, No rebound - guarding or rigidity. No Cyanosis, Clubbing or edema, No new Rash or bruise       Data Review:    CBC Recent Labs  Lab  02/15/20 1639 02/16/20 0530 02/17/20 0324  WBC 9.2 8.8 12.4*  HGB 11.8* 11.5* 11.7*  HCT 38.2 36.3 37.0  PLT 240 225 270  MCV 85.1 84.8 84.5  MCH 26.3 26.9 26.7  MCHC 30.9 31.7 31.6  RDW 14.6 14.8 14.6  LYMPHSABS 1.3  --   --   MONOABS 0.7  --   --   EOSABS 0.0  --   --   BASOSABS 0.0  --   --     Chemistries  Recent Labs  Lab 02/16/20 0004 02/16/20 0530 02/17/20 0324  NA 135  --  138  K 3.8  --  3.6  CL 99  --  99  CO2 25  --  27  GLUCOSE 116*  --  109*  BUN 11  --  15  CREATININE 0.95 0.80 0.77  CALCIUM 8.5*  --  8.6*  AST 28  --  29  ALT 19  --  24  ALKPHOS 63  --  58  BILITOT 0.4  --  0.2*   ------------------------------------------------------------------------------------------------------------------ No results for input(s): CHOL, HDL, LDLCALC, TRIG, CHOLHDL, LDLDIRECT in the last 72 hours.  No results found for: HGBA1C ------------------------------------------------------------------------------------------------------------------ No results for input(s): TSH, T4TOTAL, T3FREE, THYROIDAB in the last 72 hours.  Invalid input(s): FREET3 ------------------------------------------------------------------------------------------------------------------ Recent Labs    02/16/20 0530  FERRITIN 142    Coagulation profile No results for  input(s): INR, PROTIME in the last 168 hours.  Recent Labs    02/16/20 0830 02/17/20 0324  DDIMER 0.50 0.54*    Cardiac Enzymes No results for input(s): CKMB, TROPONINI, MYOGLOBIN in the last 168 hours.  Invalid input(s): CK ------------------------------------------------------------------------------------------------------------------ No results found for: BNP  Inpatient Medications  Scheduled Meds: . aspirin EC  81 mg Oral Once per day on Mon Wed Fri  . dexamethasone (DECADRON) injection  6 mg Intravenous Q24H  . enoxaparin (LOVENOX) injection  40 mg Subcutaneous Daily  . famotidine  20 mg Oral QHS  .  levothyroxine  50 mcg Oral QAC breakfast  . lisinopril  40 mg Oral Daily   Continuous Infusions: . remdesivir 100 mg in NS 100 mL 100 mg (02/17/20 0847)   PRN Meds:.acetaminophen, chlorpheniramine-HYDROcodone, FLUoxetine, guaiFENesin-dextromethorphan, ondansetron **OR** ondansetron (ZOFRAN) IV  Micro Results Recent Results (from the past 240 hour(s))  SARS CORONAVIRUS 2 (TAT 6-24 HRS) Nasopharyngeal Nasopharyngeal Swab     Status: Abnormal   Collection Time: 02/13/20  4:08 PM   Specimen: Nasopharyngeal Swab  Result Value Ref Range Status   SARS Coronavirus 2 POSITIVE (A) NEGATIVE Final    Comment: E MAIL M BROWNING @0626  02/14/20 BY S GEZAHEGN (NOTE) SARS-CoV-2 target nucleic acids are DETECTED. The SARS-CoV-2 RNA is generally detectable in upper and lower respiratory specimens during the acute phase of infection. Positive results are indicative of the presence of SARS-CoV-2 RNA. Clinical correlation with patient history and other diagnostic information is  necessary to determine patient infection status. Positive results do not rule out bacterial infection or co-infection with other viruses.  The expected result is Negative. Fact Sheet for Patients: 04/15/20 Fact Sheet for Healthcare Providers: HairSlick.no This test is not yet approved or cleared by the [employer]ir[employer] FDA and  has been authorized for detection and/or diagnosis of SARS-CoV-2 by FDA under an Emergency Use Authorization (EUA). This EUA will remain  in effect (meaning this test can be used) for the duration of the COVID-19 declaration und er Section 564(b)(1) of the Act, 21 U.S.C. section 360bbb-3(b)(1), unless the authorization is terminated or revoked sooner. Performed at [facility], [address], [place], [place]     Radiology Reports DG Chest Portable 1 View  Result Date: 02/16/2020 CLINICAL DATA:  Cough and shortness of  breath. EXAM: PORTABLE CHEST 1 VIEW COMPARISON:  None. FINDINGS: Mild infiltrates are seen within bilateral lung bases. There is no evidence of a pleural effusion or pneumothorax. The heart size and mediastinal contours are within normal limits. The visualized skeletal structures are unremarkable. IMPRESSION: Mild bibasilar infiltrates. Electronically Signed   By: 04/17/2020 M.D.   On: 02/16/2020 00:26     04/17/2020 M.D on 02/17/2020 at 12:49 PM  Between 7am to 7pm - Pager - [phone]  After 7pm go to [email or web address] - password [facility]  [facility] -  Office  [phone]

## 2020-02-18 ENCOUNTER — Ambulatory Visit (HOSPITAL_COMMUNITY): Payer: 59

## 2020-02-18 LAB — COMPREHENSIVE METABOLIC PANEL
ALT: 23 U/L (ref 0–44)
AST: 26 U/L (ref 15–41)
Albumin: 3.1 g/dL — ABNORMAL LOW (ref 3.5–5.0)
Alkaline Phosphatase: 59 U/L (ref 38–126)
Anion gap: 10 (ref 5–15)
BUN: 13 mg/dL (ref 8–23)
CO2: 27 mmol/L (ref 22–32)
Calcium: 8.8 mg/dL — ABNORMAL LOW (ref 8.9–10.3)
Chloride: 102 mmol/L (ref 98–111)
Creatinine, Ser: 0.66 mg/dL (ref 0.44–1.00)
GFR calc Af Amer: >60 mL/min (ref >60)
GFR calc non Af Amer: >60 mL/min (ref >60)
Glucose, Bld: 106 mg/dL — ABNORMAL HIGH (ref 70–99)
Potassium: 4.2 mmol/L (ref 3.5–5.1)
Sodium: 139 mmol/L (ref 135–145)
Total Bilirubin: 0.1 mg/dL — ABNORMAL LOW (ref 0.3–1.2)
Total Protein: 7.1 g/dL (ref 6.5–8.1)

## 2020-02-18 LAB — CBC
HCT: 36.7 % (ref 36.0–46.0)
Hemoglobin: 11.4 g/dL — ABNORMAL LOW (ref 12.0–15.0)
MCH: 26.5 pg (ref 26.0–34.0)
MCHC: 31.1 g/dL (ref 30.0–36.0)
MCV: 85.3 fL (ref 80.0–100.0)
Platelets: 274 10*3/uL (ref 150–400)
RBC: 4.3 MIL/uL (ref 3.87–5.11)
RDW: 14.8 % (ref 11.5–15.5)
WBC: 8.7 10*3/uL (ref 4.0–10.5)
nRBC: 0 % (ref 0.0–0.2)

## 2020-02-18 LAB — D-DIMER, QUANTITATIVE: D-Dimer, Quant: 0.38 ug/mL-FEU (ref 0.00–0.50)

## 2020-02-18 LAB — C-REACTIVE PROTEIN: CRP: 8.7 mg/dL — ABNORMAL HIGH (ref <1.0)

## 2020-02-18 MED ORDER — PANTOPRAZOLE SODIUM 40 MG PO TBEC
40.0000 mg | DELAYED_RELEASE_TABLET | Freq: Every day | ORAL | Status: DC
  Administered 2020-02-18 – 2020-02-21 (×4): 40 mg via ORAL
  Filled 2020-02-18 (×4): qty 1

## 2020-02-18 NOTE — Progress Notes (Signed)
PROGRESS NOTE                                                                                                                                                                                                             Patient Demographics:    Misty Pruitt, is a 64 y.o. female, DOB - [birth year], WHQ:[id number]  Admit date - 02/15/2020   Admitting Physician Rise Patience, MD  Outpatient Primary MD for the patient is Jani Gravel, MD  LOS - 2   Chief Complaint  Patient presents with  . Cough  . Shortness of Breath       Brief Narrative    Misty Pruitt is a 64 y.o. female with history of hypertension hypothyroidism presents to the ER with complaints of ongoing fatigue weakness diarrhea shortness of breath nonproductive cough subjective feeling of fever and chills which has been ongoing for last 2 weeks.  Patient states multiple family members are Covid positive at home.    Subjective:    Misty Pruitt today reports some dyspnea, mainly on exertion, cough and congestion, denies any chest pain .   Assessment  & Plan :    Principal Problem:   Acute respiratory failure due to COVID-19 [facility]) Active Problems:   Essential hypertension   Hypothyroidism  Acute respiratory failure with hypoxia secondary to COVID-19 infection - Chest x-ray significant for opacity suspicious for COVID-19 pneumonia, patient remained on 2 L nasal cannula this morning, desaturating to 85% with activity she required 3 L nasal cannula . - continue with steroids and remedsivir -Continue to trend inflammatory markers . -She was encouraged to use incentive spirometry and flutter valve.  Hypertension  - on lisinopril.  Hypothyroidism - on Synthroid.  Normocytic anemia -Stable   COVID-19 Labs  Recent Labs    02/16/20 0530 02/16/20 0830 02/17/20 0324 02/18/20 0315  DDIMER  --  0.50 0.54* 0.38  FERRITIN 142  --   --   --   CRP 8.8*  --  6.5* 8.7*    Lab  Results  Component Value Date   SARSCOV2NAA POSITIVE (A) 02/13/2020     Code Status : Full  Disposition Plan  : Home  Barriers For Discharge :  remains on IV steroids and remdesivir  Consults  :  None  Procedures  : None  DVT Prophylaxis  :  [place] lovenox  Lab Results  Component Value Date   PLT 274 02/18/2020    Antibiotics  :    Anti-infectives (From admission, onward)   Start     Dose/Rate Route Frequency Ordered Stop   02/17/20 1000  remdesivir 100 mg in sodium chloride 0.9 % 100 mL IVPB  Status:  Discontinued     100 mg 200 mL/hr over 30 Minutes Intravenous Daily 02/16/20 0526 02/16/20 0535   02/17/20 1000  remdesivir 100 mg in sodium chloride 0.9 % 100 mL IVPB     100 mg 200 mL/hr over 30 Minutes Intravenous Daily 02/16/20 0536 02/21/20 0959   02/16/20 0600  remdesivir 100 mg in sodium chloride 0.9 % 100 mL IVPB     100 mg 200 mL/hr over 30 Minutes Intravenous Every 30 min 02/16/20 0536 02/16/20 0705   02/16/20 0530  remdesivir 200 mg in sodium chloride 0.9% 250 mL IVPB  Status:  Discontinued     200 mg 580 mL/hr over 30 Minutes Intravenous Once 02/16/20 0526 02/16/20 0535        Objective:   Vitals:   02/17/20 0505 02/17/20 1500 02/17/20 1933 02/18/20 0524  BP: (!) 119/58  109/71 (!) 100/50  Pulse: 68 79 86 73  Resp: 20 17 20 18   Temp: 98.3 F (36.8 C)  98.5 F (36.9 C) 98.4 F (36.9 C)  TempSrc: Oral  Oral Oral  SpO2: 91% 93% 92% 93%  Weight:      Height:        Wt Readings from Last 3 Encounters:  02/15/20 83.5 kg     Intake/Output Summary (Last 24 hours) at 02/18/2020 1535 Last data filed at 02/17/2020 2105 Gross per 24 hour  Intake 236 ml  Output --  Net 236 ml     Physical Exam  Awake Alert, Oriented X 3, No new F.N deficits, Normal affect Symmetrical Chest wall movement, Good air movement bilaterally, CTAB RRR,No Gallops,Rubs or new Murmurs, No Parasternal Heave +ve B.Sounds, Abd Soft, No tenderness, No rebound - guarding or  rigidity. No Cyanosis, Clubbing or edema, No new Rash or bruise        Data Review:    CBC Recent Labs  Lab 02/15/20 1639 02/16/20 0530 02/17/20 0324 02/18/20 0315  WBC 9.2 8.8 12.4* 8.7  HGB 11.8* 11.5* 11.7* 11.4*  HCT 38.2 36.3 37.0 36.7  PLT 240 225 270 274  MCV 85.1 84.8 84.5 85.3  MCH 26.3 26.9 26.7 26.5  MCHC 30.9 31.7 31.6 31.1  RDW 14.6 14.8 14.6 14.8  LYMPHSABS 1.3  --   --   --   MONOABS 0.7  --   --   --   EOSABS 0.0  --   --   --   BASOSABS 0.0  --   --   --     Chemistries  Recent Labs  Lab 02/16/20 0004 02/16/20 0530 02/17/20 0324 02/18/20 0315  NA 135  --  138 139  K 3.8  --  3.6 4.2  CL 99  --  99 102  CO2 25  --  27 27  GLUCOSE 116*  --  109* 106*  BUN 11  --  15 13  CREATININE 0.95 0.80 0.77 0.66  CALCIUM 8.5*  --  8.6* 8.8*  AST 28  --  29 26  ALT 19  --  24 23  ALKPHOS 63  --  58 59  BILITOT 0.4  --  0.2* 0.1*   ------------------------------------------------------------------------------------------------------------------ No  results for input(s): CHOL, HDL, LDLCALC, TRIG, CHOLHDL, LDLDIRECT in the last 72 hours.  No results found for: HGBA1C ------------------------------------------------------------------------------------------------------------------ No results for input(s): TSH, T4TOTAL, T3FREE, THYROIDAB in the last 72 hours.  Invalid input(s): FREET3 ------------------------------------------------------------------------------------------------------------------ Recent Labs    02/16/20 0530  FERRITIN 142    Coagulation profile No results for input(s): INR, PROTIME in the last 168 hours.  Recent Labs    02/17/20 0324 02/18/20 0315  DDIMER 0.54* 0.38    Cardiac Enzymes No results for input(s): CKMB, TROPONINI, MYOGLOBIN in the last 168 hours.  Invalid input(s): CK ------------------------------------------------------------------------------------------------------------------ No results found for:  BNP  Inpatient Medications  Scheduled Meds: . aspirin EC  81 mg Oral Once per day on Mon Wed Fri  . dexamethasone (DECADRON) injection  6 mg Intravenous Q24H  . enoxaparin (LOVENOX) injection  40 mg Subcutaneous Daily  . famotidine  20 mg Oral QHS  . levothyroxine  50 mcg Oral QAC breakfast  . lisinopril  40 mg Oral Daily   Continuous Infusions: . remdesivir 100 mg in NS 100 mL 100 mg (02/18/20 0854)   PRN Meds:.acetaminophen, chlorpheniramine-HYDROcodone, FLUoxetine, guaiFENesin-dextromethorphan, ondansetron **OR** ondansetron (ZOFRAN) IV  Micro Results Recent Results (from the past 240 hour(s))  SARS CORONAVIRUS 2 (TAT 6-24 HRS) Nasopharyngeal Nasopharyngeal Swab     Status: Abnormal   Collection Time: 02/13/20  4:08 PM   Specimen: Nasopharyngeal Swab  Result Value Ref Range Status   SARS Coronavirus 2 POSITIVE (A) NEGATIVE Final    Comment: E MAIL M BROWNING @0626  02/14/20 BY S GEZAHEGN (NOTE) SARS-CoV-2 target nucleic acids are DETECTED. The SARS-CoV-2 RNA is generally detectable in upper and lower respiratory specimens during the acute phase of infection. Positive results are indicative of the presence of SARS-CoV-2 RNA. Clinical correlation with patient history and other diagnostic information is  necessary to determine patient infection status. Positive results do not rule out bacterial infection or co-infection with other viruses.  The expected result is Negative. Fact Sheet for Patients: 04/15/20 Fact Sheet for Healthcare Providers: HairSlick.no This test is not yet approved or cleared by the [employer] FDA and  has been authorized for detection and/or diagnosis of SARS-CoV-2 by FDA under an Emergency Use Authorization (EUA). This EUA will remain  in effect (meaning this test can be used) for the duration of the COVID-19 declaration und er Section 564(b)(1) of the Act, 21 U.S.C. section 360bbb-3(b)(1),  unless the authorization is terminated or revoked sooner. Performed at [facility], [address], [place], [place]     Radiology Reports DG Chest Portable 1 View  Result Date: 02/16/2020 CLINICAL DATA:  Cough and shortness of breath. EXAM: PORTABLE CHEST 1 VIEW COMPARISON:  None. FINDINGS: Mild infiltrates are seen within bilateral lung bases. There is no evidence of a pleural effusion or pneumothorax. The heart size and mediastinal contours are within normal limits. The visualized skeletal structures are unremarkable. IMPRESSION: Mild bibasilar infiltrates. Electronically Signed   By: 04/17/2020 M.D.   On: 02/16/2020 00:26     04/17/2020 M.D on 02/18/2020 at 3:35 PM  Between 7am to 7pm - Pager - [phone]  After 7pm go to [email or web address] - password [facility]  [facility] -  Office  [phone]

## 2020-02-19 ENCOUNTER — Ambulatory Visit (HOSPITAL_COMMUNITY): Payer: 59

## 2020-02-19 LAB — COMPREHENSIVE METABOLIC PANEL
ALT: 22 U/L (ref 0–44)
AST: 22 U/L (ref 15–41)
Albumin: 2.9 g/dL — ABNORMAL LOW (ref 3.5–5.0)
Alkaline Phosphatase: 52 U/L (ref 38–126)
Anion gap: 11 (ref 5–15)
BUN: 13 mg/dL (ref 8–23)
CO2: 28 mmol/L (ref 22–32)
Calcium: 8.5 mg/dL — ABNORMAL LOW (ref 8.9–10.3)
Chloride: 101 mmol/L (ref 98–111)
Creatinine, Ser: 0.73 mg/dL (ref 0.44–1.00)
GFR calc Af Amer: >60 mL/min (ref >60)
GFR calc non Af Amer: >60 mL/min (ref >60)
Glucose, Bld: 119 mg/dL — ABNORMAL HIGH (ref 70–99)
Potassium: 4 mmol/L (ref 3.5–5.1)
Sodium: 140 mmol/L (ref 135–145)
Total Bilirubin: 0.4 mg/dL (ref 0.3–1.2)
Total Protein: 6.9 g/dL (ref 6.5–8.1)

## 2020-02-19 LAB — CBC
HCT: 34.5 % — ABNORMAL LOW (ref 36.0–46.0)
Hemoglobin: 10.8 g/dL — ABNORMAL LOW (ref 12.0–15.0)
MCH: 26.3 pg (ref 26.0–34.0)
MCHC: 31.3 g/dL (ref 30.0–36.0)
MCV: 83.9 fL (ref 80.0–100.0)
Platelets: 300 10*3/uL (ref 150–400)
RBC: 4.11 MIL/uL (ref 3.87–5.11)
RDW: 14.6 % (ref 11.5–15.5)
WBC: 8.3 10*3/uL (ref 4.0–10.5)
nRBC: 0 % (ref 0.0–0.2)

## 2020-02-19 LAB — C-REACTIVE PROTEIN: CRP: 8.3 mg/dL — ABNORMAL HIGH (ref <1.0)

## 2020-02-19 LAB — MRSA PCR SCREENING: MRSA by PCR: NEGATIVE

## 2020-02-19 LAB — D-DIMER, QUANTITATIVE: D-Dimer, Quant: 0.51 ug/mL-FEU — ABNORMAL HIGH (ref 0.00–0.50)

## 2020-02-19 NOTE — Plan of Care (Signed)
Patient making progress towards goals; patient up and able to use incentive spirometer.  Patient must be prompted to use incentive spirometer, or will not use it.  Patient states "it makes me cough too much." Patient O2 needs increased during shift to 4L/minute, patient required extra oxygen after desat to 78% post-ambulation.     Problem: Education: Goal: Knowledge of risk factors and measures for prevention of condition will improve Outcome: Progressing   Problem: Coping: Goal: Psychosocial and spiritual needs will be supported Outcome: Progressing   Problem: Respiratory: Goal: Will maintain a patent airway Outcome: Progressing Goal: Complications related to the disease process, condition or treatment will be avoided or minimized Outcome: Progressing

## 2020-02-19 NOTE — Progress Notes (Signed)
PROGRESS NOTE                                                                                                                                                                                                             Patient Demographics:    Misty Pruitt, is a 64 y.o. female, DOB - [birth year], IRC:[id number]  Admit date - 02/15/2020   Admitting Physician Eduard Clos, MD  Outpatient Primary MD for the patient is Pearson Grippe, MD  LOS - 3   Chief Complaint  Patient presents with  . Cough  . Shortness of Breath       Brief Narrative    Misty Pruitt is a 64 y.o. female with history of hypertension hypothyroidism presents to the ER with complaints of ongoing fatigue weakness diarrhea shortness of breath nonproductive cough subjective feeling of fever and chills which has been ongoing for last 2 weeks.  Patient states multiple family members are Covid positive at home.    Subjective:    Misty Pruitt today reports some dyspnea, mainly on exertion, cough and congestion, denies any chest pain .   Assessment  & Plan :    Principal Problem:   Acute respiratory failure due to COVID-19 [facility]) Active Problems:   Essential hypertension   Hypothyroidism  Acute respiratory failure with hypoxia secondary to COVID-19 infection - Chest x-ray significant for opacity suspicious for COVID-19 pneumonia, patient remained on 2 L nasal cannula this morning, she still has significant desaturation with activity urine with oxygen. - continue with steroids -Continue with IV remdesivir  -Continue to trend inflammatory markers , socially with CRP trending up. -She was encouraged to use incentive spirometry and flutter valve.  Hypertension  - on lisinopril.  Hypothyroidism - on Synthroid.  Normocytic anemia -Stable   COVID-19 Labs  Recent Labs    02/17/20 0324 02/18/20 0315 02/19/20 0338  DDIMER 0.54* 0.38 0.51*  CRP 6.5* 8.7* 8.3*    Lab  Results  Component Value Date   SARSCOV2NAA POSITIVE (A) 02/13/2020     Code Status : Full  Disposition Plan  : Home  Barriers For Discharge :  remains on IV steroids and remdesivir  Consults  :  None  Procedures  : None  DVT Prophylaxis  :  [place] lovenox  Lab Results  Component Value Date   PLT  300 02/19/2020    Antibiotics  :    Anti-infectives (From admission, onward)   Start     Dose/Rate Route Frequency Ordered Stop   02/17/20 1000  remdesivir 100 mg in sodium chloride 0.9 % 100 mL IVPB  Status:  Discontinued     100 mg 200 mL/hr over 30 Minutes Intravenous Daily 02/16/20 0526 02/16/20 0535   02/17/20 1000  remdesivir 100 mg in sodium chloride 0.9 % 100 mL IVPB     100 mg 200 mL/hr over 30 Minutes Intravenous Daily 02/16/20 0536 02/21/20 0959   02/16/20 0600  remdesivir 100 mg in sodium chloride 0.9 % 100 mL IVPB     100 mg 200 mL/hr over 30 Minutes Intravenous Every 30 min 02/16/20 0536 02/16/20 0705   02/16/20 0530  remdesivir 200 mg in sodium chloride 0.9% 250 mL IVPB  Status:  Discontinued     200 mg 580 mL/hr over 30 Minutes Intravenous Once 02/16/20 0526 02/16/20 0535        Objective:   Vitals:   02/19/20 0147 02/19/20 0426 02/19/20 0553 02/19/20 0704  BP:  (!) 111/56    Pulse: 93 65 72 65  Resp: 19 19 (!) 22 18  Temp:  99 F (37.2 C)    TempSrc:  Oral    SpO2: (!) 79% 90% 93% 94%  Weight:      Height:        Wt Readings from Last 3 Encounters:  02/15/20 83.5 kg     Intake/Output Summary (Last 24 hours) at 02/19/2020 1420 Last data filed at 02/19/2020 1102 Gross per 24 hour  Intake 554.59 ml  Output --  Net 554.59 ml     Physical Exam  Awake Alert, Oriented X 3, No new F.N deficits, Normal affect Symmetrical Chest wall movement, Good air movement bilaterally, CTAB RRR,No Gallops,Rubs or new Murmurs, No Parasternal Heave +ve B.Sounds, Abd Soft, No tenderness, No rebound - guarding or rigidity. No Cyanosis, Clubbing or edema, No new  Rash or bruise         Data Review:    CBC Recent Labs  Lab 02/15/20 1639 02/16/20 0530 02/17/20 0324 02/18/20 0315 02/19/20 0338  WBC 9.2 8.8 12.4* 8.7 8.3  HGB 11.8* 11.5* 11.7* 11.4* 10.8*  HCT 38.2 36.3 37.0 36.7 34.5*  PLT 240 225 270 274 300  MCV 85.1 84.8 84.5 85.3 83.9  MCH 26.3 26.9 26.7 26.5 26.3  MCHC 30.9 31.7 31.6 31.1 31.3  RDW 14.6 14.8 14.6 14.8 14.6  LYMPHSABS 1.3  --   --   --   --   MONOABS 0.7  --   --   --   --   EOSABS 0.0  --   --   --   --   BASOSABS 0.0  --   --   --   --     Chemistries  Recent Labs  Lab 02/16/20 0004 02/16/20 0530 02/17/20 0324 02/18/20 0315 02/19/20 0338  NA 135  --  138 139 140  K 3.8  --  3.6 4.2 4.0  CL 99  --  99 102 101  CO2 25  --  27 27 28   GLUCOSE 116*  --  109* 106* 119*  BUN 11  --  15 13 13   CREATININE 0.95 0.80 0.77 0.66 0.73  CALCIUM 8.5*  --  8.6* 8.8* 8.5*  AST 28  --  29 26 22   ALT 19  --  24 23 22   ALKPHOS  63  --  58 59 52  BILITOT 0.4  --  0.2* 0.1* 0.4   ------------------------------------------------------------------------------------------------------------------ No results for input(s): CHOL, HDL, LDLCALC, TRIG, CHOLHDL, LDLDIRECT in the last 72 hours.  No results found for: HGBA1C ------------------------------------------------------------------------------------------------------------------ No results for input(s): TSH, T4TOTAL, T3FREE, THYROIDAB in the last 72 hours.  Invalid input(s): FREET3 ------------------------------------------------------------------------------------------------------------------ No results for input(s): VITAMINB12, FOLATE, FERRITIN, TIBC, IRON, RETICCTPCT in the last 72 hours.  Coagulation profile No results for input(s): INR, PROTIME in the last 168 hours.  Recent Labs    02/18/20 0315 02/19/20 0338  DDIMER 0.38 0.51*    Cardiac Enzymes No results for input(s): CKMB, TROPONINI, MYOGLOBIN in the last 168 hours.  Invalid input(s):  CK ------------------------------------------------------------------------------------------------------------------ No results found for: BNP  Inpatient Medications  Scheduled Meds: . aspirin EC  81 mg Oral Once per day on Mon Wed Fri  . dexamethasone (DECADRON) injection  6 mg Intravenous Q24H  . enoxaparin (LOVENOX) injection  40 mg Subcutaneous Daily  . famotidine  20 mg Oral QHS  . levothyroxine  50 mcg Oral QAC breakfast  . lisinopril  40 mg Oral Daily  . pantoprazole  40 mg Oral Daily   Continuous Infusions: . remdesivir 100 mg in NS 100 mL 100 mg (02/19/20 0911)   PRN Meds:.acetaminophen, chlorpheniramine-HYDROcodone, FLUoxetine, guaiFENesin-dextromethorphan, ondansetron **OR** ondansetron (ZOFRAN) IV  Micro Results Recent Results (from the past 240 hour(s))  SARS CORONAVIRUS 2 (TAT 6-24 HRS) Nasopharyngeal Nasopharyngeal Swab     Status: Abnormal   Collection Time: 02/13/20  4:08 PM   Specimen: Nasopharyngeal Swab  Result Value Ref Range Status   SARS Coronavirus 2 POSITIVE (A) NEGATIVE Final    Comment: E MAIL M BROWNING @0626  02/14/20 BY S GEZAHEGN (NOTE) SARS-CoV-2 target nucleic acids are DETECTED. The SARS-CoV-2 RNA is generally detectable in upper and lower respiratory specimens during the acute phase of infection. Positive results are indicative of the presence of SARS-CoV-2 RNA. Clinical correlation with patient history and other diagnostic information is  necessary to determine patient infection status. Positive results do not rule out bacterial infection or co-infection with other viruses.  The expected result is Negative. Fact Sheet for Patients: 04/15/20 Fact Sheet for Healthcare Providers: HairSlick.no This test is not yet approved or cleared by the quierodirigir.com FDA and  has been authorized for detection and/or diagnosis of SARS-CoV-2 by FDA under an Emergency Use Authorization (EUA). This  EUA will remain  in effect (meaning this test can be used) for the duration of the COVID-19 declaration und er Section 564(b)(1) of the Act, 21 U.S.C. section 360bbb-3(b)(1), unless the authorization is terminated or revoked sooner. Performed at [facility], [address], [place], [place]   MRSA PCR Screening     Status: None   Collection Time: 02/19/20  5:52 AM   Specimen: Nasal Mucosa; Nasopharyngeal  Result Value Ref Range Status   MRSA by PCR NEGATIVE NEGATIVE Final    Comment:        The GeneXpert MRSA Assay (FDA approved for NASAL specimens only), is one component of a comprehensive MRSA colonization surveillance program. It is not intended to diagnose MRSA infection nor to guide or monitor treatment for MRSA infections. Performed at [facility], [address], [place], [place]     Radiology Reports DG Chest Portable 1 View  Result Date: 02/16/2020 CLINICAL DATA:  Cough and shortness of breath. EXAM: PORTABLE CHEST 1 VIEW COMPARISON:  None. FINDINGS: Mild infiltrates are  seen within bilateral lung bases. There is no evidence of a pleural effusion or pneumothorax. The heart size and mediastinal contours are within normal limits. The visualized skeletal structures are unremarkable. IMPRESSION: Mild bibasilar infiltrates. Electronically Signed   By: Aram Candela M.D.   On: 02/16/2020 00:26     Huey Bienenstock M.D on 02/19/2020 at 2:20 PM  Between 7am to 7pm - Pager - [phone]  After 7pm go to [email or web address] - password [facility]  [facility] -  Office  [phone]

## 2020-02-19 NOTE — Progress Notes (Signed)
   02/19/20 0147  Vitals  Pulse Rate 93  ECG Heart Rate 90  Resp 19  Oxygen Therapy  SpO2 (!) 79 %  O2 Device Nasal Cannula  O2 Flow Rate (L/min) 4 L/min  Patient Activity (if Appropriate) Ambulating  Oximetry Probe Site Changed Yes  MEWS Score  MEWS Temp 0  MEWS Systolic 0  MEWS Pulse 0  MEWS RR 0  MEWS LOC 0  MEWS Score 0  MEWS Score Color Green   Patient destated post ambulation to bathroom, patient O2 increased to 4L per minute at 0147.  Prolonged recovery time noted, patient titrated back down to 2L at 0553 after remaining greater than 90% for at least 1 hour.

## 2020-02-20 ENCOUNTER — Inpatient Hospital Stay (HOSPITAL_COMMUNITY): Payer: 59

## 2020-02-20 ENCOUNTER — Ambulatory Visit (HOSPITAL_COMMUNITY): Payer: 59

## 2020-02-20 LAB — COMPREHENSIVE METABOLIC PANEL
ALT: 29 U/L (ref 0–44)
AST: 26 U/L (ref 15–41)
Albumin: 2.8 g/dL — ABNORMAL LOW (ref 3.5–5.0)
Alkaline Phosphatase: 61 U/L (ref 38–126)
Anion gap: 9 (ref 5–15)
BUN: 13 mg/dL (ref 8–23)
CO2: 27 mmol/L (ref 22–32)
Calcium: 8.6 mg/dL — ABNORMAL LOW (ref 8.9–10.3)
Chloride: 100 mmol/L (ref 98–111)
Creatinine, Ser: 0.73 mg/dL (ref 0.44–1.00)
GFR calc Af Amer: >60 mL/min (ref >60)
GFR calc non Af Amer: >60 mL/min (ref >60)
Glucose, Bld: 134 mg/dL — ABNORMAL HIGH (ref 70–99)
Potassium: 4 mmol/L (ref 3.5–5.1)
Sodium: 136 mmol/L (ref 135–145)
Total Bilirubin: 0.3 mg/dL (ref 0.3–1.2)
Total Protein: 6.8 g/dL (ref 6.5–8.1)

## 2020-02-20 LAB — CBC
HCT: 34.3 % — ABNORMAL LOW (ref 36.0–46.0)
Hemoglobin: 10.8 g/dL — ABNORMAL LOW (ref 12.0–15.0)
MCH: 26.3 pg (ref 26.0–34.0)
MCHC: 31.5 g/dL (ref 30.0–36.0)
MCV: 83.7 fL (ref 80.0–100.0)
Platelets: 320 10*3/uL (ref 150–400)
RBC: 4.1 MIL/uL (ref 3.87–5.11)
RDW: 14.5 % (ref 11.5–15.5)
WBC: 11.7 10*3/uL — ABNORMAL HIGH (ref 4.0–10.5)
nRBC: 0 % (ref 0.0–0.2)

## 2020-02-20 LAB — D-DIMER, QUANTITATIVE: D-Dimer, Quant: 0.56 ug/mL-FEU — ABNORMAL HIGH (ref 0.00–0.50)

## 2020-02-20 LAB — C-REACTIVE PROTEIN: CRP: 12.2 mg/dL — ABNORMAL HIGH (ref <1.0)

## 2020-02-20 MED ORDER — FUROSEMIDE 10 MG/ML IJ SOLN
60.0000 mg | Freq: Once | INTRAMUSCULAR | Status: AC
  Administered 2020-02-20: 60 mg via INTRAVENOUS
  Filled 2020-02-20: qty 6

## 2020-02-20 NOTE — Progress Notes (Signed)
SATURATION QUALIFICATIONS: (This note is used to comply with regulatory documentation for home oxygen)  Patient Saturations on Room Air at Rest = 86%  Patient Saturations on Room Air while Ambulating = 78%  Patient Saturations on 4 Liters of oxygen while Ambulating = 91%  Please briefly explain why patient needs home oxygen: pt hypoxic on room air while ambulating.

## 2020-02-20 NOTE — Plan of Care (Signed)
  Problem: Education: Goal: Knowledge of risk factors and measures for prevention of condition will improve Outcome: Progressing   Problem: Coping: Goal: Psychosocial and spiritual needs will be supported Outcome: Progressing   Problem: Respiratory: Goal: Will maintain a patent airway Outcome: Progressing Goal: Complications related to the disease process, condition or treatment will be avoided or minimized Outcome: Progressing   

## 2020-02-20 NOTE — Progress Notes (Signed)
PROGRESS NOTE                                                                                                                                                                                                             Patient Demographics:    Misty Pruitt, is a 64 y.o. female, DOB - 1955-12-22, KYH:[id number]  Admit date - 02/15/2020   Admitting Physician Rise Patience, MD  Outpatient Primary MD for the patient is Jani Gravel, MD  LOS - 4   Chief Complaint  Patient presents with  . Cough  . Shortness of Breath       Brief Narrative    Misty Pruitt is a 64 y.o. female with history of hypertension hypothyroidism presents to the ER with complaints of ongoing fatigue weakness diarrhea shortness of breath nonproductive cough subjective feeling of fever and chills which has been ongoing for last 2 weeks.  Patient states multiple family members are Covid positive at home.    Subjective:    Misty Pruitt today complains of cough, and reports some hypoxia with exertion, denies any chest pain .   Assessment  & Plan :    Principal Problem:   Acute respiratory failure due to COVID-19 [facility]) Active Problems:   Essential hypertension   Hypothyroidism  Acute respiratory failure with hypoxia secondary to COVID-19 infection - Chest x-ray significant for opacity suspicious for COVID-19 pneumonia, remains hypoxic at rest and with activity, with significant oxygen requirement, she is requiring 2 L at rest, and 4 L with activity . -CRP is trending up, pain chest x-ray . -Give 40 mg IV Lasix once today . - continue with steroids -Continue with IV remdesivir  -Continue to trend inflammatory markers , socially with CRP trending up. -She was encouraged to use incentive spirometry and flutter valve.  Hypertension  - on lisinopril.  Hypothyroidism - on Synthroid.  Normocytic anemia -Stable   COVID-19 Labs  Recent Labs    02/18/20 0315  02/19/20 0338 02/20/20 0343  DDIMER 0.38 0.51* 0.56*  CRP 8.7* 8.3* 12.2*    Lab Results  Component Value Date   SARSCOV2NAA POSITIVE (A) 02/13/2020     Code Status : Full  Disposition Plan  : Home  Barriers For Discharge :  remains on IV steroids and remdesivir, remains hypoxic as well  Consults  :  None  Procedures  : None  DVT Prophylaxis  :  [place] lovenox  Lab Results  Component Value Date   PLT 320 02/20/2020    Antibiotics  :    Anti-infectives (From admission, onward)   Start     Dose/Rate Route Frequency Ordered Stop   02/17/20 1000  remdesivir 100 mg in sodium chloride 0.9 % 100 mL IVPB  Status:  Discontinued     100 mg 200 mL/hr over 30 Minutes Intravenous Daily 02/16/20 0526 02/16/20 0535   02/17/20 1000  remdesivir 100 mg in sodium chloride 0.9 % 100 mL IVPB     100 mg 200 mL/hr over 30 Minutes Intravenous Daily 02/16/20 0536 02/20/20 0857   02/16/20 0600  remdesivir 100 mg in sodium chloride 0.9 % 100 mL IVPB     100 mg 200 mL/hr over 30 Minutes Intravenous Every 30 min 02/16/20 0536 02/16/20 0705   02/16/20 0530  remdesivir 200 mg in sodium chloride 0.9% 250 mL IVPB  Status:  Discontinued     200 mg 580 mL/hr over 30 Minutes Intravenous Once 02/16/20 0526 02/16/20 0535        Objective:   Vitals:   02/19/20 2234 02/20/20 0514 02/20/20 0608 02/20/20 0825  BP: 113/66 (!) 108/47 (!) 101/53 102/65  Pulse: 72 76 68 77  Resp: 16 20  20   Temp: 98.6 F (37 C) 98.3 F (36.8 C)  98.7 F (37.1 C)  TempSrc: Oral Oral  Oral  SpO2: 91% 91% 96% 90%  Weight:      Height:        Wt Readings from Last 3 Encounters:  02/15/20 83.5 kg     Intake/Output Summary (Last 24 hours) at 02/20/2020 1451 Last data filed at 02/20/2020 1354 Gross per 24 hour  Intake 960 ml  Output --  Net 960 ml     Physical Exam  Awake Alert, Oriented X 3, No new F.N deficits, Normal affect Symmetrical Chest wall movement, Good air movement bilaterally, CTAB RRR,No  Gallops,Rubs or new Murmurs, No Parasternal Heave +ve B.Sounds, Abd Soft, No tenderness, No rebound - guarding or rigidity. No Cyanosis, Clubbing or edema, No new Rash or bruise          Data Review:    CBC Recent Labs  Lab 02/15/20 1639 02/15/20 1639 02/16/20 0530 02/17/20 0324 02/18/20 0315 02/19/20 0338 02/20/20 0343  WBC 9.2   < > 8.8 12.4* 8.7 8.3 11.7*  HGB 11.8*   < > 11.5* 11.7* 11.4* 10.8* 10.8*  HCT 38.2   < > 36.3 37.0 36.7 34.5* 34.3*  PLT 240   < > 225 270 274 300 320  MCV 85.1   < > 84.8 84.5 85.3 83.9 83.7  MCH 26.3   < > 26.9 26.7 26.5 26.3 26.3  MCHC 30.9   < > 31.7 31.6 31.1 31.3 31.5  RDW 14.6   < > 14.8 14.6 14.8 14.6 14.5  LYMPHSABS 1.3  --   --   --   --   --   --   MONOABS 0.7  --   --   --   --   --   --   EOSABS 0.0  --   --   --   --   --   --   BASOSABS 0.0  --   --   --   --   --   --    < > = values in this interval not displayed.  Chemistries  Recent Labs  Lab 02/16/20 0004 02/16/20 0004 02/16/20 0530 02/17/20 0324 02/18/20 0315 02/19/20 0338 02/20/20 0343  NA 135  --   --  138 139 140 136  K 3.8  --   --  3.6 4.2 4.0 4.0  CL 99  --   --  99 102 101 100  CO2 25  --   --  27 27 28 27   GLUCOSE 116*  --   --  109* 106* 119* 134*  BUN 11  --   --  15 13 13 13   CREATININE 0.95   < > 0.80 0.77 0.66 0.73 0.73  CALCIUM 8.5*  --   --  8.6* 8.8* 8.5* 8.6*  AST 28  --   --  29 26 22 26   ALT 19  --   --  24 23 22 29   ALKPHOS 63  --   --  58 59 52 61  BILITOT 0.4  --   --  0.2* 0.1* 0.4 0.3   < > = values in this interval not displayed.   ------------------------------------------------------------------------------------------------------------------ No results for input(s): CHOL, HDL, LDLCALC, TRIG, CHOLHDL, LDLDIRECT in the last 72 hours.  No results found for: HGBA1C ------------------------------------------------------------------------------------------------------------------ No results for input(s): TSH, T4TOTAL, T3FREE,  THYROIDAB in the last 72 hours.  Invalid input(s): FREET3 ------------------------------------------------------------------------------------------------------------------ No results for input(s): VITAMINB12, FOLATE, FERRITIN, TIBC, IRON, RETICCTPCT in the last 72 hours.  Coagulation profile No results for input(s): INR, PROTIME in the last 168 hours.  Recent Labs    02/19/20 0338 02/20/20 0343  DDIMER 0.51* 0.56*    Cardiac Enzymes No results for input(s): CKMB, TROPONINI, MYOGLOBIN in the last 168 hours.  Invalid input(s): CK ------------------------------------------------------------------------------------------------------------------ No results found for: BNP  Inpatient Medications  Scheduled Meds: . aspirin EC  81 mg Oral Once per day on Mon Wed Fri  . dexamethasone (DECADRON) injection  6 mg Intravenous Q24H  . enoxaparin (LOVENOX) injection  40 mg Subcutaneous Daily  . famotidine  20 mg Oral QHS  . furosemide  60 mg Intravenous Once  . levothyroxine  50 mcg Oral QAC breakfast  . lisinopril  40 mg Oral Daily  . pantoprazole  40 mg Oral Daily   Continuous Infusions:  PRN Meds:.acetaminophen, chlorpheniramine-HYDROcodone, FLUoxetine, guaiFENesin-dextromethorphan, ondansetron **OR** ondansetron (ZOFRAN) IV  Micro Results Recent Results (from the past 240 hour(s))  SARS CORONAVIRUS 2 (TAT 6-24 HRS) Nasopharyngeal Nasopharyngeal Swab     Status: Abnormal   Collection Time: 02/13/20  4:08 PM   Specimen: Nasopharyngeal Swab  Result Value Ref Range Status   SARS Coronavirus 2 POSITIVE (A) NEGATIVE Final    Comment: E MAIL M BROWNING @0626  02/14/20 BY S GEZAHEGN (NOTE) SARS-CoV-2 target nucleic acids are DETECTED. The SARS-CoV-2 RNA is generally detectable in upper and lower respiratory specimens during the acute phase of infection. Positive results are indicative of the presence of SARS-CoV-2 RNA. Clinical correlation with patient history and other diagnostic  information is  necessary to determine patient infection status. Positive results do not rule out bacterial infection or co-infection with other viruses.  The expected result is Negative. Fact Sheet for Patients: 01-05-1996 Fact Sheet for Healthcare Providers: Tue This test is not yet approved or cleared by the 04/14/20 FDA and  has been authorized for detection and/or diagnosis of SARS-CoV-2 by FDA under an Emergency Use Authorization (EUA). This EUA will remain  in effect (meaning this test can be used) for the duration of the COVID-19 declaration und er  Section 564(b)(1) of the Act, 21 U.S.C. section 360bbb-3(b)(1), unless the authorization is terminated or revoked sooner. Performed at [facility], [address], [place], [place] [address]   MRSA PCR Screening     Status: None   Collection Time: 02/19/20  5:52 AM   Specimen: Nasal Mucosa; Nasopharyngeal  Result Value Ref Range Status   MRSA by PCR NEGATIVE NEGATIVE Final    Comment:        The GeneXpert MRSA Assay (FDA approved for NASAL specimens only), is one component of a comprehensive MRSA colonization surveillance program. It is not intended to diagnose MRSA infection nor to guide or monitor treatment for MRSA infections. Performed at [facility], [address] [place], [place] [address]     Radiology Reports DG Chest Portable 1 View  Result Date: 02/16/2020 CLINICAL DATA:  Cough and shortness of breath. EXAM: PORTABLE CHEST 1 VIEW COMPARISON:  None. FINDINGS: Mild infiltrates are seen within bilateral lung bases. There is no evidence of a pleural effusion or pneumothorax. The heart size and mediastinal contours are within normal limits. The visualized skeletal structures are unremarkable. IMPRESSION: Mild bibasilar infiltrates. Electronically Signed   By: Aram Candela M.D.   On: 02/16/2020 00:26     Huey Bienenstock M.D  on 02/20/2020 at 2:51 PM  Between 7am to 7pm - Pager - [phone]  After 7pm go to [email or web address] - password [facility]  [facility] -  Office  [phone]

## 2020-02-21 DIAGNOSIS — R0902 Hypoxemia: Secondary | ICD-10-CM

## 2020-02-21 LAB — CBC
HCT: 34.9 % — ABNORMAL LOW (ref 36.0–46.0)
Hemoglobin: 11.1 g/dL — ABNORMAL LOW (ref 12.0–15.0)
MCH: 26.7 pg (ref 26.0–34.0)
MCHC: 31.8 g/dL (ref 30.0–36.0)
MCV: 84.1 fL (ref 80.0–100.0)
Platelets: 363 10*3/uL (ref 150–400)
RBC: 4.15 MIL/uL (ref 3.87–5.11)
RDW: 14.6 % (ref 11.5–15.5)
WBC: 9.8 10*3/uL (ref 4.0–10.5)
nRBC: 0 % (ref 0.0–0.2)

## 2020-02-21 LAB — COMPREHENSIVE METABOLIC PANEL
ALT: 27 U/L (ref 0–44)
AST: 22 U/L (ref 15–41)
Albumin: 2.8 g/dL — ABNORMAL LOW (ref 3.5–5.0)
Alkaline Phosphatase: 61 U/L (ref 38–126)
Anion gap: 12 (ref 5–15)
BUN: 17 mg/dL (ref 8–23)
CO2: 28 mmol/L (ref 22–32)
Calcium: 8.9 mg/dL (ref 8.9–10.3)
Chloride: 97 mmol/L — ABNORMAL LOW (ref 98–111)
Creatinine, Ser: 0.77 mg/dL (ref 0.44–1.00)
GFR calc Af Amer: >60 mL/min (ref >60)
GFR calc non Af Amer: >60 mL/min (ref >60)
Glucose, Bld: 152 mg/dL — ABNORMAL HIGH (ref 70–99)
Potassium: 3.8 mmol/L (ref 3.5–5.1)
Sodium: 137 mmol/L (ref 135–145)
Total Bilirubin: 0.1 mg/dL — ABNORMAL LOW (ref 0.3–1.2)
Total Protein: 6.9 g/dL (ref 6.5–8.1)

## 2020-02-21 LAB — D-DIMER, QUANTITATIVE: D-Dimer, Quant: 0.59 ug/mL-FEU — ABNORMAL HIGH (ref 0.00–0.50)

## 2020-02-21 LAB — C-REACTIVE PROTEIN: CRP: 11.6 mg/dL — ABNORMAL HIGH (ref <1.0)

## 2020-02-21 LAB — BRAIN NATRIURETIC PEPTIDE: B Natriuretic Peptide: 26.7 pg/mL (ref 0.0–100.0)

## 2020-02-21 MED ORDER — DEXAMETHASONE SODIUM PHOSPHATE 10 MG/ML IJ SOLN
10.0000 mg | INTRAMUSCULAR | Status: DC
  Administered 2020-02-21: 10 mg via INTRAVENOUS
  Filled 2020-02-21: qty 1

## 2020-02-21 MED ORDER — BENZONATATE 100 MG PO CAPS: 100.0000 mg | ORAL_CAPSULE | Freq: Four times a day (QID) | ORAL | 0 refills | Status: AC | PRN

## 2020-02-21 MED ORDER — ALUM & MAG HYDROXIDE-SIMETH 200-200-20 MG/5ML PO SUSP
30.0000 mL | ORAL | Status: DC | PRN
  Administered 2020-02-21: 30 mL via ORAL
  Filled 2020-02-21: qty 30

## 2020-02-21 MED ORDER — DEXAMETHASONE 6 MG PO TABS: 6.0000 mg | ORAL_TABLET | Freq: Every day | ORAL | 0 refills | Status: DC

## 2020-02-21 NOTE — Progress Notes (Signed)
D/c education provided to pt. Pt has no questions at this time. Sister to pick up pt. Home oxygen has been delivered to room. There are no other needs at this time.

## 2020-02-21 NOTE — Discharge Summary (Signed)
PATIENT DETAILS Name: ALEC MCPHEE Age: 64 y.o. Sex: female Date of Birth: 1956-03-05 MRN: [id number]. Admitting Physician: Rise Patience, MD PCP:Kim, Jeneen Rinks, MD  Admit Date: 02/15/2020 Discharge date: 02/21/2020  Recommendations for Outpatient Follow-up:  1. Follow up with PCP in 1-2 weeks 2. Please obtain CMP/CBC in one week 3. Repeat Chest Xray in 4-6 week 4. Please reassess home O2 requirement at next visit.  Admitted From:  Home  Disposition: [place]: No  Equipment/Devices: oxygen 2L  Discharge Condition: Stable  CODE STATUS: FULL CODE  Diet recommendation:  Diet Order            Diet - low sodium heart healthy        Diet Heart Room service appropriate? Yes; Fluid consistency: Thin  Diet effective now               Brief Summary: See H&P, Labs, Consult and Test reports for all details in brief, patient is a 64 year old female with history of HTN, hypothyroidism-who presented to the ED with fever, weakness, diarrhea and shortness of breath-found to have acute hypoxic respiratory failure secondary to COVID-19 pneumonia  Brief Hospital Course: Acute Hypoxic Respiratory Failure secondary to COVID-19 pneumonia: Much better-ambulating in the room without much problem this morning.  However she does desaturate and requires around 2 L of oxygen to maintain O2 saturations.  Has completed a course of remdesivir-remains on steroids.  Her CRP is still elevated but seems to be trending down compared to yesterday.  She is adamant about going home today and requests discharge.  Since she is relatively stable-requiring only 2 L of oxygen-and has completed remdesivir infusion-suspect she is stable to go home with oxygen.  She is aware that if she develops worsening shortness of breath/fatigue-she should seek immediate medical attention.  COVID-19 Labs:  Recent Labs    02/19/20 0338 02/20/20 0343 02/21/20 0307  DDIMER 0.51* 0.56* 0.59*  CRP 8.3*  12.2* 11.6*    Lab Results  Component Value Date   SARSCOV2NAA POSITIVE (A) 02/13/2020    HTN: Controlled-continue lisinopril  Hypothyroidism: Continue Synthroid  Normocytic anemia: Appears to be mild-stable for outpatient follow-up with PCP.  Obesity: Estimated body mass index is 30.62 kg/m as calculated from the following:   Height as of this encounter: 5\' 5"  (1.651 m).   Weight as of this encounter: 83.5 kg.    Procedures/Studies: None  Discharge Diagnoses:  Principal Problem:   Acute respiratory failure due to COVID-19 [facility]) Active Problems:   Essential hypertension   Hypothyroidism   Discharge Instructions:    Person Under Monitoring Name: STARIA BIRKHEAD  Location: [address] [place] [address]   Infection Prevention Recommendations for Individuals Confirmed to have, or Being Evaluated for, 2019 Novel Coronavirus (COVID-19) Infection Who Receive Care at Home  Individuals who are confirmed to have, or are being evaluated for, COVID-19 should follow the prevention steps below until a healthcare provider or local or state health department says they can return to normal activities.  Stay home except to get medical care You should restrict activities outside your home, except for getting medical care. Do not go to work, school, or public areas, and do not use public transportation or taxis.  Call ahead before visiting your doctor Before your medical appointment, call the healthcare provider and tell them that you have, or are being evaluated for, COVID-19 infection. This will help the healthcare provider's office take steps to keep other people from  getting infected. Ask your healthcare provider to call the local or state health department.  Monitor your symptoms Seek prompt medical attention if your illness is worsening (e.g., difficulty breathing). Before going to your medical appointment, call the healthcare provider and tell them that you have, or  are being evaluated for, COVID-19 infection. Ask your healthcare provider to call the local or state health department.  Wear a facemask You should wear a facemask that covers your nose and mouth when you are in the same room with other people and when you visit a healthcare provider. People who live with or visit you should also wear a facemask while they are in the same room with you.  Separate yourself from other people in your home As much as possible, you should stay in a different room from other people in your home. Also, you should use a separate bathroom, if available.  Avoid sharing household items You should not share dishes, drinking glasses, cups, eating utensils, towels, bedding, or other items with other people in your home. After using these items, you should wash them thoroughly with soap and water.  Cover your coughs and sneezes Cover your mouth and nose with a tissue when you cough or sneeze, or you can cough or sneeze into your sleeve. Throw used tissues in a lined trash can, and immediately wash your hands with soap and water for at least 20 seconds or use an alcohol-based hand rub.  Wash your [employer] your hands often and thoroughly with soap and water for at least 20 seconds. You can use an alcohol-based hand sanitizer if soap and water are not available and if your hands are not visibly dirty. Avoid touching your eyes, nose, and mouth with unwashed hands.   Prevention Steps for Caregivers and Household Members of Individuals Confirmed to have, or Being Evaluated for, COVID-19 Infection Being Cared for in the Home  If you live with, or provide care at home for, a person confirmed to have, or being evaluated for, COVID-19 infection please follow these guidelines to prevent infection:  Follow healthcare provider's instructions Make sure that you understand and can help the patient follow any healthcare provider instructions for all care.  Provide for the  patient's basic needs You should help the patient with basic needs in the home and provide support for getting groceries, prescriptions, and other personal needs.  Monitor the patient's symptoms If they are getting sicker, call his or her medical provider and tell them that the patient has, or is being evaluated for, COVID-19 infection. This will help the healthcare provider's office take steps to keep other people from getting infected. Ask the healthcare provider to call the local or state health department.  Limit the number of people who have contact with the patient  If possible, have only one caregiver for the patient.  Other household members should stay in another home or place of residence. If this is not possible, they should stay  in another room, or be separated from the patient as much as possible. Use a separate bathroom, if available.  Restrict visitors who do not have an essential need to be in the home.  Keep older adults, very young children, and other sick people away from the patient Keep older adults, very young children, and those who have compromised immune systems or chronic health conditions away from the patient. This includes people with chronic heart, lung, or kidney conditions, diabetes, and cancer.  Ensure good ventilation Make sure that  shared spaces in the home have good air flow, such as from an air conditioner or an opened window, weather permitting.  Wash your hands often  Wash your hands often and thoroughly with soap and water for at least 20 seconds. You can use an alcohol based hand sanitizer if soap and water are not available and if your hands are not visibly dirty.  Avoid touching your eyes, nose, and mouth with unwashed hands.  Use disposable paper towels to dry your hands. If not available, use dedicated cloth towels and replace them when they become wet.  Wear a facemask and gloves  Wear a disposable facemask at all times in the room  and gloves when you touch or have contact with the patient's blood, body fluids, and/or secretions or excretions, such as sweat, saliva, sputum, nasal mucus, vomit, urine, or feces.  Ensure the mask fits over your nose and mouth tightly, and do not touch it during use.  Throw out disposable facemasks and gloves after using them. Do not reuse.  Wash your hands immediately after removing your facemask and gloves.  If your personal clothing becomes contaminated, carefully remove clothing and launder. Wash your hands after handling contaminated clothing.  Place all used disposable facemasks, gloves, and other waste in a lined container before disposing them with other household waste.  Remove gloves and wash your hands immediately after handling these items.  Do not share dishes, glasses, or other household items with the patient  Avoid sharing household items. You should not share dishes, drinking glasses, cups, eating utensils, towels, bedding, or other items with a patient who is confirmed to have, or being evaluated for, COVID-19 infection.  After the person uses these items, you should wash them thoroughly with soap and water.  Wash laundry thoroughly  Immediately remove and wash clothes or bedding that have blood, body fluids, and/or secretions or excretions, such as sweat, saliva, sputum, nasal mucus, vomit, urine, or feces, on them.  Wear gloves when handling laundry from the patient.  Read and follow directions on labels of laundry or clothing items and detergent. In general, wash and dry with the warmest temperatures recommended on the label.  Clean all areas the individual has used often  Clean all touchable surfaces, such as counters, tabletops, doorknobs, bathroom fixtures, toilets, phones, keyboards, tablets, and bedside tables, every day. Also, clean any surfaces that may have blood, body fluids, and/or secretions or excretions on them.  Wear gloves when cleaning surfaces the  patient has come in contact with.  Use a diluted bleach solution (e.g., dilute bleach with 1 part bleach and 10 parts water) or a household disinfectant with a label that says EPA-registered for coronaviruses. To make a bleach solution at home, add 1 tablespoon of bleach to 1 quart (4 cups) of water. For a larger supply, add  cup of bleach to 1 gallon (16 cups) of water.  Read labels of cleaning products and follow recommendations provided on product labels. Labels contain instructions for safe and effective use of the cleaning product including precautions you should take when applying the product, such as wearing gloves or eye protection and making sure you have good ventilation during use of the product.  Remove gloves and wash hands immediately after cleaning.  Monitor yourself for signs and symptoms of illness Caregivers and household members are considered close contacts, should monitor their health, and will be asked to limit movement outside of the home to the extent possible. Follow the monitoring steps for  close contacts listed on the symptom monitoring form.   ? If you have additional questions, contact your local health department or call the epidemiologist on call at [phone] (available 24/7). ? This guidance is subject to change. For the most up-to-date guidance from CDC, please refer to their website: TripMetro.hu    Activity:  As tolerated   Discharge Instructions    Call MD for:  difficulty breathing, headache or visual disturbances   Complete by: As directed    Call MD for:  persistant dizziness or light-headedness   Complete by: As directed    Call MD for:  persistant nausea and vomiting   Complete by: As directed    Diet - low sodium heart healthy   Complete by: As directed    Discharge instructions   Complete by: As directed    1.)  3 weeks of isolation from 02/13/2020   Follow with Primary MD   Pearson Grippe, MD in 1-2 weeks  Please get a complete blood count and chemistry panel checked by your Primary MD at your next visit, and again as instructed by your Primary MD.  Get Medicines reviewed and adjusted: Please take all your medications with you for your next visit with your Primary MD  Laboratory/radiological data: Please request your Primary MD to go over all hospital tests and procedure/radiological results at the follow up, please ask your Primary MD to get all Hospital records sent to his/her office.  In some cases, they will be blood work, cultures and biopsy results pending at the time of your discharge. Please request that your primary care M.D. follows up on these results.  Also Note the following: If you experience worsening of your admission symptoms, develop shortness of breath, life threatening emergency, suicidal or homicidal thoughts you must seek medical attention immediately by calling 911 or calling your MD immediately  if symptoms less severe.  You must read complete instructions/literature along with all the possible adverse reactions/side effects for all the Medicines you take and that have been prescribed to you. Take any new Medicines after you have completely understood and accpet all the possible adverse reactions/side effects.   Do not drive when taking Pain medications or sleeping medications (Benzodaizepines)  Do not take more than prescribed Pain, Sleep and Anxiety Medications. It is not advisable to combine anxiety,sleep and pain medications without talking with your primary care practitioner  Special Instructions: If you have smoked or chewed Tobacco  in the last 2 yrs please stop smoking, stop any regular Alcohol  and or any Recreational drug use.  Wear Seat belts while driving.  Please note: You were cared for by a hospitalist during your hospital stay. Once you are discharged, your primary care physician will handle any further medical issues.  Please note that NO REFILLS for any discharge medications will be authorized once you are discharged, as it is imperative that you return to your primary care physician (or establish a relationship with a primary care physician if you do not have one) for your post hospital discharge needs so that they can reassess your need for medications and monitor your lab values.   Increase activity slowly   Complete by: As directed      Allergies as of 02/21/2020   No Known Allergies     Medication List    TAKE these medications   aspirin EC 81 MG tablet Take 81 mg by mouth 3 (three) times a week.   b complex vitamins tablet Take 1  tablet by mouth daily with breakfast.   benzonatate 100 MG capsule Commonly known as: Tessalon Perles Take 1 capsule (100 mg total) by mouth every 6 (six) hours as needed for cough.   CALCIUM PO Take 1 tablet by mouth daily with breakfast.   cetirizine 10 MG tablet Commonly known as: ZYRTEC Take 10 mg by mouth daily as needed for allergies or rhinitis (or "seasonal allergies").   dexamethasone 6 MG tablet Commonly known as: DECADRON Take 1 tablet (6 mg total) by mouth daily.   FLUoxetine 40 MG capsule Commonly known as: PROZAC Take 40 mg by mouth daily as needed (for mood).   fluticasone 50 MCG/ACT nasal spray Commonly known as: FLONASE Place 1-2 sprays into both nostrils daily as needed for allergies or rhinitis (or "seasonal allergies").   levothyroxine 50 MCG tablet Commonly known as: SYNTHROID Take 50 mcg by mouth daily before breakfast.   lisinopril 40 MG tablet Commonly known as: ZESTRIL Take 40 mg by mouth daily.   multivitamin-lutein Caps capsule Take 1 capsule by mouth daily.   VITAMIN D-3 PO Take 1 capsule by mouth daily with breakfast.   VITAMIN E PO Take 1 capsule by mouth daily with breakfast.            Durable Medical Equipment  (From admission, onward)         Start     Ordered   02/21/20 1024  For home use only DME  oxygen  Once    Question Answer Comment  Length of Need 6 Months   Mode or (Route) Nasal cannula   Liters per Minute 2   Frequency Continuous (stationary and portable oxygen unit needed)   Oxygen conserving device Yes   Oxygen delivery system Gas      02/21/20 1023         Follow-up Information    [facility] Follow up.   Why: Home oxygen       Pearson Grippe, MD. Schedule an appointment as soon as possible for a visit in 1 week(s).   Specialty: Internal Medicine Contact information: [address] [place] [address] [phone]          No Known Allergies  Consultations:   None  Other Procedures/Studies: DG Chest Port 1 View  Result Date: 02/20/2020 CLINICAL DATA:  COVID-19 positive, acute respiratory failure EXAM: PORTABLE CHEST 1 VIEW COMPARISON:  02/16/2020 FINDINGS: Single frontal view of the chest demonstrates minimal progression of the bibasilar consolidation seen previously consistent with multifocal pneumonia. No effusion or pneumothorax. Cardiac silhouette is stable. IMPRESSION: 1. Mild progression of the multifocal bibasilar pneumonia. Electronically Signed   By: Sharlet Salina M.D.   On: 02/20/2020 19:15   DG Chest Portable 1 View  Result Date: 02/16/2020 CLINICAL DATA:  Cough and shortness of breath. EXAM: PORTABLE CHEST 1 VIEW COMPARISON:  None. FINDINGS: Mild infiltrates are seen within bilateral lung bases. There is no evidence of a pleural effusion or pneumothorax. The heart size and mediastinal contours are within normal limits. The visualized skeletal structures are unremarkable. IMPRESSION: Mild bibasilar infiltrates. Electronically Signed   By: Aram Candela M.D.   On: 02/16/2020 00:26      TODAY-DAY OF DISCHARGE:  Subjective:   Jordynn Marcella today has no headache,no chest abdominal pain,no new weakness tingling or numbness, feels much better wants to go home today.   Objective:   Blood pressure 98/82, pulse 64,  temperature 98.3 F (36.8 C), temperature source Oral, resp. rate 14, height  5\' 5"  (1.651 m), weight 83.5 kg, SpO2 92 %.  Intake/Output Summary (Last 24 hours) at 02/21/2020 1110 Last data filed at 02/21/2020 1000 Gross per 24 hour  Intake 1080 ml  Output 1650 ml  Net -570 ml   Filed Weights   02/15/20 1616  Weight: 83.5 kg    Exam: Awake Alert, Oriented *3, No new F.N deficits, Normal affect [place].AT,PERRAL Supple Neck,No JVD, No cervical lymphadenopathy appriciated.  Symmetrical Chest wall movement, Good air movement bilaterally, CTAB RRR,No Gallops,Rubs or new Murmurs, No Parasternal Heave +ve B.Sounds, Abd Soft, Non tender, No organomegaly appriciated, No rebound -guarding or rigidity. No Cyanosis, Clubbing or edema, No new Rash or bruise   PERTINENT RADIOLOGIC STUDIES: DG Chest Port 1 View  Result Date: 02/20/2020 CLINICAL DATA:  COVID-19 positive, acute respiratory failure EXAM: PORTABLE CHEST 1 VIEW COMPARISON:  02/16/2020 FINDINGS: Single frontal view of the chest demonstrates minimal progression of the bibasilar consolidation seen previously consistent with multifocal pneumonia. No effusion or pneumothorax. Cardiac silhouette is stable. IMPRESSION: 1. Mild progression of the multifocal bibasilar pneumonia. Electronically Signed   By: 04/17/2020 M.D.   On: 02/20/2020 19:15   DG Chest Portable 1 View  Result Date: 02/16/2020 CLINICAL DATA:  Cough and shortness of breath. EXAM: PORTABLE CHEST 1 VIEW COMPARISON:  None. FINDINGS: Mild infiltrates are seen within bilateral lung bases. There is no evidence of a pleural effusion or pneumothorax. The heart size and mediastinal contours are within normal limits. The visualized skeletal structures are unremarkable. IMPRESSION: Mild bibasilar infiltrates. Electronically Signed   By: 04/17/2020 M.D.   On: 02/16/2020 00:26     PERTINENT LAB RESULTS: CBC: Recent Labs    02/20/20 0343 02/21/20 0307  WBC 11.7* 9.8  HGB 10.8*  11.1*  HCT 34.3* 34.9*  PLT 320 363   CMET CMP     Component Value Date/Time   NA 137 02/21/2020 0307   K 3.8 02/21/2020 0307   CL 97 (L) 02/21/2020 0307   CO2 28 02/21/2020 0307   GLUCOSE 152 (H) 02/21/2020 0307   BUN 17 02/21/2020 0307   CREATININE 0.77 02/21/2020 0307   CALCIUM 8.9 02/21/2020 0307   PROT 6.9 02/21/2020 0307   ALBUMIN 2.8 (L) 02/21/2020 0307   AST 22 02/21/2020 0307   ALT 27 02/21/2020 0307   ALKPHOS 61 02/21/2020 0307   BILITOT 0.1 (L) 02/21/2020 0307   GFRNONAA >60 02/21/2020 0307   GFRAA >60 02/21/2020 0307    GFR Estimated Creatinine Clearance: 77.8 mL/min (by C-G formula based on SCr of 0.77 mg/dL). No results for input(s): LIPASE, AMYLASE in the last 72 hours. No results for input(s): CKTOTAL, CKMB, CKMBINDEX, TROPONINI in the last 72 hours. Invalid input(s): POCBNP Recent Labs    02/20/20 0343 02/21/20 0307  DDIMER 0.56* 0.59*   No results for input(s): HGBA1C in the last 72 hours. No results for input(s): CHOL, HDL, LDLCALC, TRIG, CHOLHDL, LDLDIRECT in the last 72 hours. No results for input(s): TSH, T4TOTAL, T3FREE, THYROIDAB in the last 72 hours.  Invalid input(s): FREET3 No results for input(s): VITAMINB12, FOLATE, FERRITIN, TIBC, IRON, RETICCTPCT in the last 72 hours. Coags: No results for input(s): INR in the last 72 hours.  Invalid input(s): PT Microbiology: Recent Results (from the past 240 hour(s))  SARS CORONAVIRUS 2 (TAT 6-24 HRS) Nasopharyngeal Nasopharyngeal Swab     Status: Abnormal   Collection Time: 02/13/20  4:08 PM   Specimen: Nasopharyngeal Swab  Result Value Ref Range Status  SARS Coronavirus 2 POSITIVE (A) NEGATIVE Final    Comment: E MAIL M BROWNING @0626  02/14/20 BY S GEZAHEGN (NOTE) SARS-CoV-2 target nucleic acids are DETECTED. The SARS-CoV-2 RNA is generally detectable in upper and lower respiratory specimens during the acute phase of infection. Positive results are indicative of the presence of SARS-CoV-2  RNA. Clinical correlation with patient history and other diagnostic information is  necessary to determine patient infection status. Positive results do not rule out bacterial infection or co-infection with other viruses.  The expected result is Negative. Fact Sheet for Patients: HairSlick.nohttps://www.fda.gov/media/138098/download Fact Sheet for Healthcare Providers: quierodirigir.comhttps://www.fda.gov/media/138095/download This test is not yet approved or cleared by the [place] FDA and  has been authorized for detection and/or diagnosis of SARS-CoV-2 by FDA under an Emergency Use Authorization (EUA). This EUA will remain  in effect (meaning this test can be used) for the duration of the COVID-19 declaration und er Section 564(b)(1) of the Act, 21 U.S.C. section 360bbb-3(b)(1), unless the authorization is terminated or revoked sooner. Performed at [facility], [address] [place], [place] [address]   MRSA PCR Screening     Status: None   Collection Time: 02/19/20  5:52 AM   Specimen: Nasal Mucosa; Nasopharyngeal  Result Value Ref Range Status   MRSA by PCR NEGATIVE NEGATIVE Final    Comment:        The GeneXpert MRSA Assay (FDA approved for NASAL specimens only), is one component of a comprehensive MRSA colonization surveillance program. It is not intended to diagnose MRSA infection nor to guide or monitor treatment for MRSA infections. Performed at [facility], [address] [place], [place] [address]     FURTHER DISCHARGE INSTRUCTIONS:  Get Medicines reviewed and adjusted: Please take all your medications with you for your next visit with your Primary MD  Laboratory/radiological data: Please request your Primary MD to go over all hospital tests and procedure/radiological results at the follow up, please ask your Primary MD to get all Hospital records sent to his/her office.  In some cases, they will be blood work, cultures and biopsy results pending at the time of your  discharge. Please request that your primary care M.D. goes through all the records of your hospital data and follows up on these results.  Also Note the following: If you experience worsening of your admission symptoms, develop shortness of breath, life threatening emergency, suicidal or homicidal thoughts you must seek medical attention immediately by calling 911 or calling your MD immediately  if symptoms less severe.  You must read complete instructions/literature along with all the possible adverse reactions/side effects for all the Medicines you take and that have been prescribed to you. Take any new Medicines after you have completely understood and accpet all the possible adverse reactions/side effects.   Do not drive when taking Pain medications or sleeping medications (Benzodaizepines)  Do not take more than prescribed Pain, Sleep and Anxiety Medications. It is not advisable to combine anxiety,sleep and pain medications without talking with your primary care practitioner  Special Instructions: If you have smoked or chewed Tobacco  in the last 2 yrs please stop smoking, stop any regular Alcohol  and or any Recreational drug use.  Wear Seat belts while driving.  Please note: You were cared for by a hospitalist during your hospital stay. Once you are discharged, your primary care physician will handle any further medical issues. Please note that NO REFILLS for any discharge medications will be authorized once you  are discharged, as it is imperative that you return to your primary care physician (or establish a relationship with a primary care physician if you do not have one) for your post hospital discharge needs so that they can reassess your need for medications and monitor your lab values.  Total Time spent coordinating discharge including counseling, education and face to face time equals 45 minutes.  SignedJeoffrey Massed 02/21/2020 11:10 AM

## 2020-02-21 NOTE — Plan of Care (Signed)

## 2020-02-21 NOTE — Progress Notes (Signed)
PT Cancellation Note  Patient Details Name: Misty Pruitt MRN: [id number] DOB: [birth year]   Cancelled Treatment:    Reason Eval/Treat Not Completed: Other (comment)(Per discussion with nurse, MD to cancel PT order)   Angelene Giovanni 02/21/2020, 1:14 PM

## 2020-02-21 NOTE — Progress Notes (Signed)
SATURATION QUALIFICATIONS: (This note is used to comply with regulatory documentation for home oxygen)  Patient Saturations on Room Air at Rest = 88%  Patient Saturations on Room Air while Ambulating = 82%  Patient Saturations on 2Liters of oxygen while Ambulating = 88%  Please briefly explain why patient needs home oxygen:  Pt needs home oxygen

## 2020-02-21 NOTE — Progress Notes (Signed)
Very anxious to go home-does not wish to remain in the hospital any longer.  Walking around in the room when I went to see her.  She feels much better-claims that her brother was discharged today-and she is asking for discharge home today.  Will discharge patient at her own request-see discharge summary for details.

## 2020-02-21 NOTE — TOC Initial Note (Addendum)
Transition of Care [facility]) - Initial/Assessment Note    Patient Details  Name: Misty Pruitt MRN: [id number] Date of Birth: [birth year]  Transition of [facility]) CM/SW Contact:    Cherylann Parr, RN Phone Number: 02/21/2020, 11:06 AM  Clinical Narrative:  PTA independent from home with family. Pt confirms she has a PCP and denied barriers with paying for discharge meds.  Pt in agreement with home oxygen as ordered - choice given, [employer] chosen for DME company. [employer] accepts referral and will provide POC prior to discharge.  Pt will be able to leave [facility] and [employer] will reach out post discharge to delivery home oxygen equipment   Discharge order signed. No outstanding TOC needs - CM singing off                Expected Discharge Plan: Home/Self Care     Patient Goals and CMS Choice   CMS Medicare.gov Compare Post Acute Care list provided to:: Patient Choice offered to / list presented to : Patient  Expected Discharge Plan and Services Expected Discharge Plan: Home/Self Care     Post Acute Care Choice: Durable Medical Equipment Living arrangements for the past 2 months: Single Family Home                   DME Agency: [employer] Date DME Agency Contacted: 02/21/20 Time DME Agency Contacted: 1105 Representative spoke with at DME Agency: Zack            Prior Living Arrangements/Services Living arrangements for the past 2 months: Single Family Home Lives with:: Relatives Patient language and need for interpreter reviewed:: Yes Do you feel safe going back to the place where you live?: Yes      Need for Family Participation in Patient Care: No (Comment) Care giver support system in place?: Yes (comment)   Criminal Activity/Legal Involvement Pertinent to Current Situation/Hospitalization: No - Comment as needed  Activities of Daily Living Home Assistive Devices/Equipment: None ADL Screening (condition at time of admission) Patient's cognitive ability  adequate to safely complete daily activities?: Yes Is the patient deaf or have difficulty hearing?: No Does the patient have difficulty seeing, even when wearing glasses/contacts?: No Does the patient have difficulty concentrating, remembering, or making decisions?: No Patient able to express need for assistance with ADLs?: No Does the patient have difficulty dressing or bathing?: No Independently performs ADLs?: Yes (appropriate for developmental age) Does the patient have difficulty walking or climbing stairs?: No Weakness of Legs: None Weakness of Arms/Hands: None  Permission Sought/Granted   Permission granted to share information with : Yes, Verbal Permission Granted              Emotional Assessment   Attitude/Demeanor/Rapport: Self-Confident, Engaged, Charismatic, Gracious Affect (typically observed): Accepting Orientation: : Oriented to Self, Oriented to Place, Oriented to  Time, Oriented to Situation   Psych Involvement: No (comment)  Admission diagnosis:  Hypoxia [R09.02] Hypotension, unspecified hypotension type [I95.9] Acute respiratory failure due to COVID-19 (HCC) [U07.1, J96.00] COVID-19 virus infection [U07.1] Patient Active Problem List   Diagnosis Date Noted  . Acute respiratory failure due to COVID-19 (HCC) 02/16/2020  . Essential hypertension 02/16/2020  . Hypothyroidism 02/16/2020   PCP:  Pearson Grippe, MD Pharmacy:   [facility] [phone] [place], [place] - [phone] AT [facility] OF [facility] & [address] [place] [address] Phone: [phone] Fax: [phone]     Social Determinants of Health (  SDOH) Interventions    Readmission Risk Interventions No flowsheet data found.

## 2021-04-13 IMAGING — DX DG CHEST 1V PORT
1 series · 1 of 1 positions shown · non-contrast
Comparison: None.

CLINICAL DATA: Cough and shortness of breath.

EXAM:
PORTABLE CHEST 1 VIEW

[chest ap]
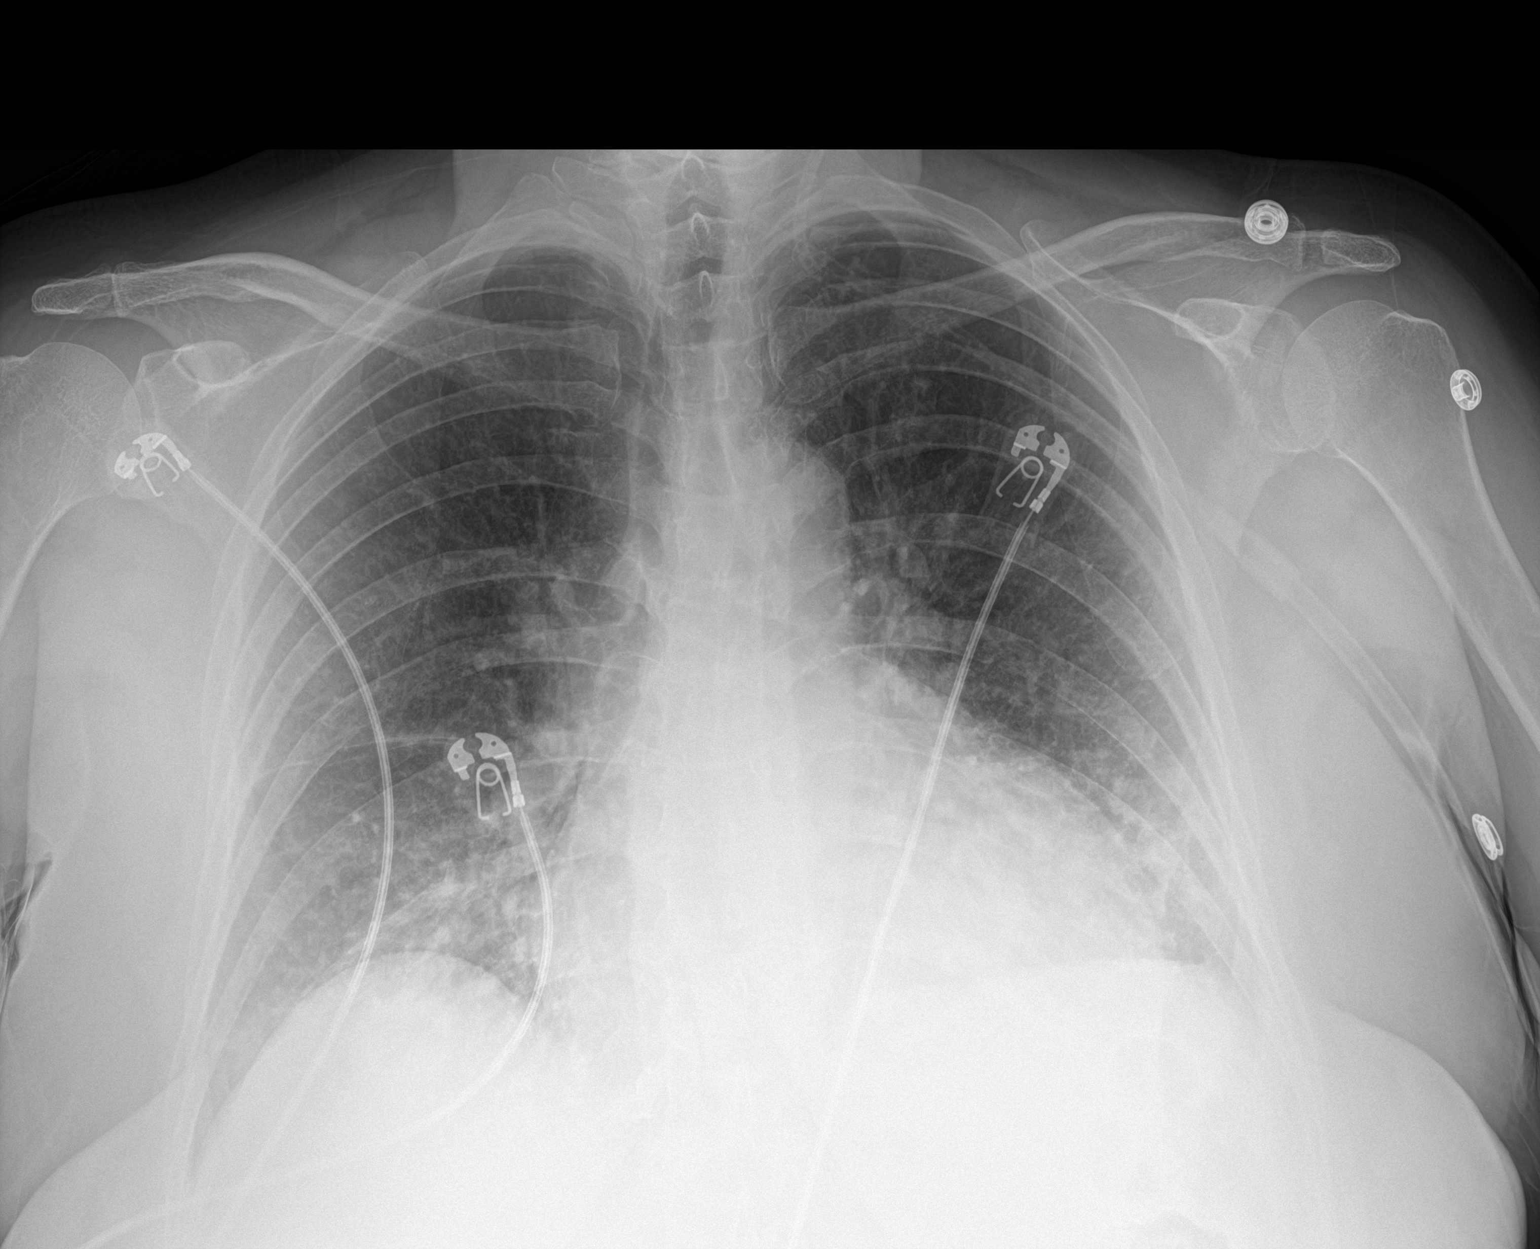

[1 of 1 positions shown; findings below may reference images not displayed]

FINDINGS: Mild infiltrates are seen within bilateral lung bases.

There is no evidence of a pleural effusion or pneumothorax.

The heart size and mediastinal contours are within normal limits.

The visualized skeletal structures are unremarkable.
IMPRESSION: Mild bibasilar infiltrates.

## 2021-04-17 IMAGING — DX DG CHEST 1V PORT
1 series · 1 of 1 positions shown · non-contrast
Comparison: 02/16/2020

CLINICAL DATA: 9HVHJ-OS positive, acute respiratory failure

EXAM:
PORTABLE CHEST 1 VIEW

[chest]
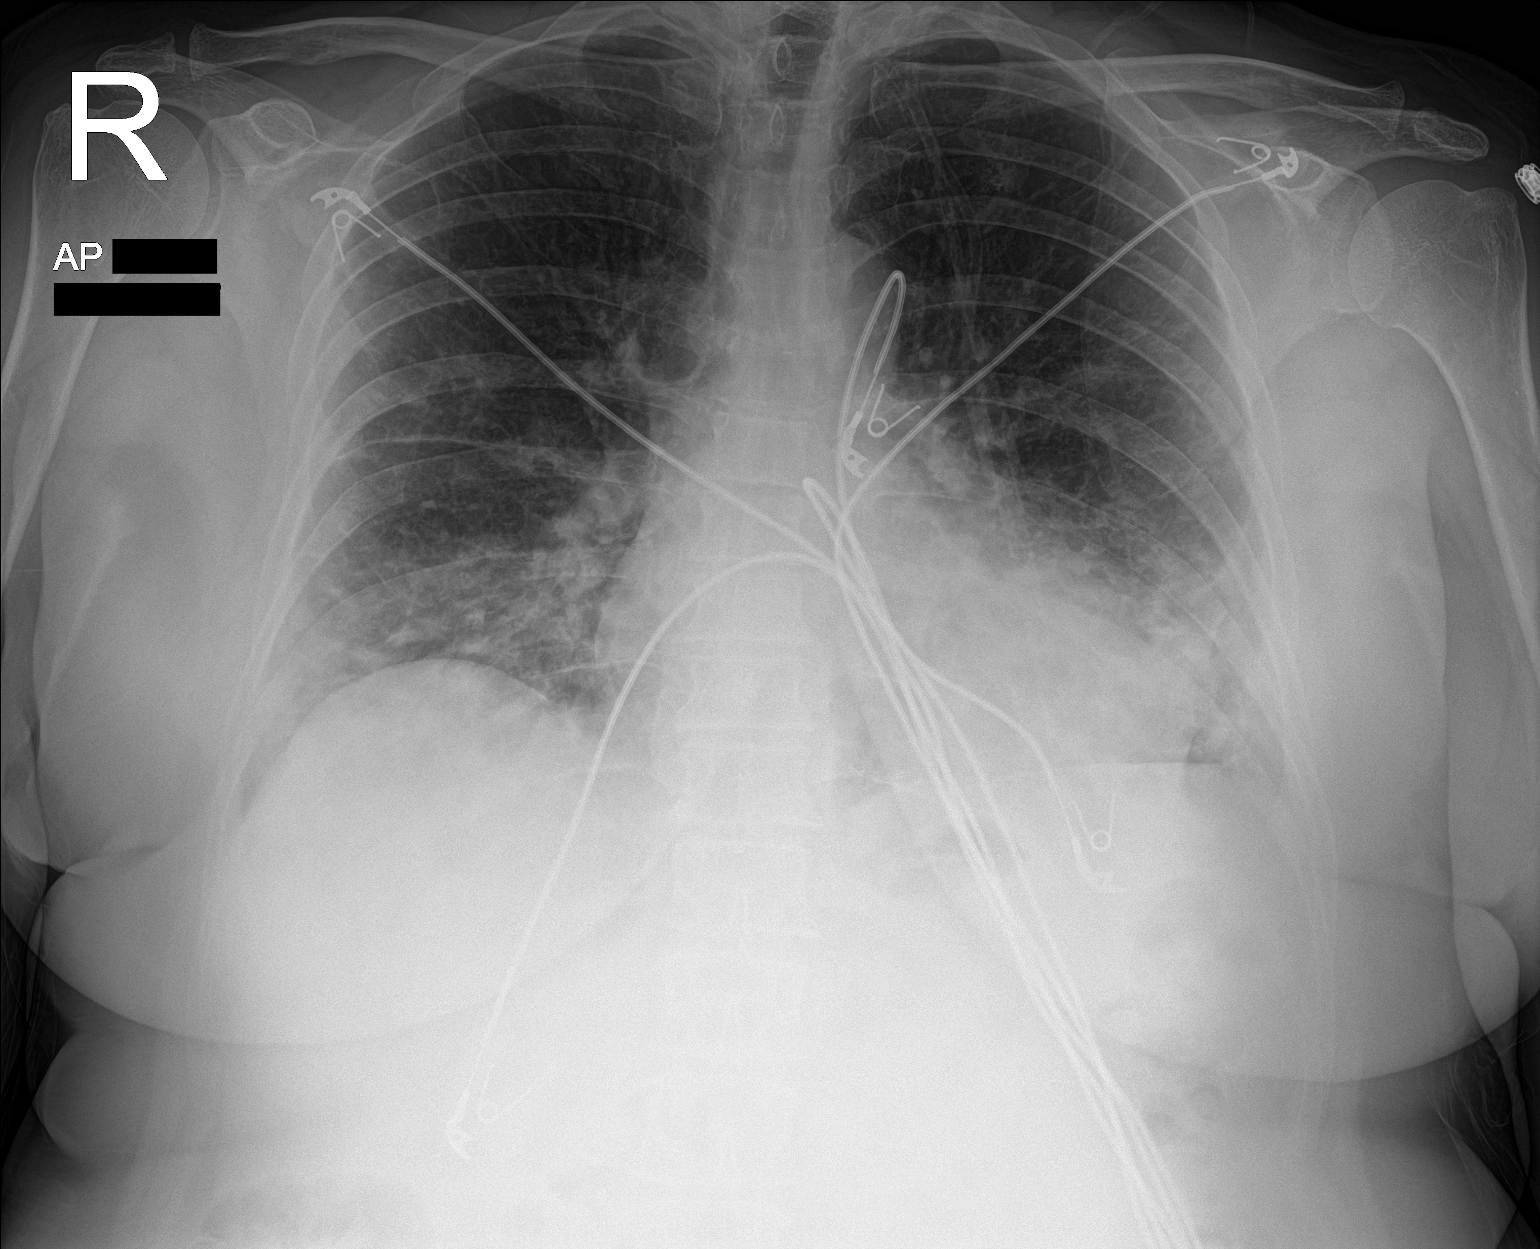

[1 of 1 positions shown; findings below may reference images not displayed]

FINDINGS: Single frontal view of the chest demonstrates minimal progression of
the bibasilar consolidation seen previously consistent with
multifocal pneumonia. No effusion or pneumothorax. Cardiac
silhouette is stable.
IMPRESSION: 1. Mild progression of the multifocal bibasilar pneumonia.

## 2022-12-15 DIAGNOSIS — E785 Hyperlipidemia, unspecified: Secondary | ICD-10-CM | POA: Diagnosis not present

## 2022-12-15 DIAGNOSIS — F419 Anxiety disorder, unspecified: Secondary | ICD-10-CM | POA: Diagnosis not present

## 2022-12-15 DIAGNOSIS — R7303 Prediabetes: Secondary | ICD-10-CM | POA: Diagnosis not present

## 2022-12-15 DIAGNOSIS — K219 Gastro-esophageal reflux disease without esophagitis: Secondary | ICD-10-CM | POA: Diagnosis not present

## 2022-12-15 DIAGNOSIS — I10 Essential (primary) hypertension: Secondary | ICD-10-CM | POA: Diagnosis not present

## 2022-12-15 DIAGNOSIS — E039 Hypothyroidism, unspecified: Secondary | ICD-10-CM | POA: Diagnosis not present

## 2023-04-27 DIAGNOSIS — E039 Hypothyroidism, unspecified: Secondary | ICD-10-CM | POA: Diagnosis not present

## 2023-04-27 DIAGNOSIS — Z0000 Encounter for general adult medical examination without abnormal findings: Secondary | ICD-10-CM | POA: Diagnosis not present

## 2023-04-27 DIAGNOSIS — R7303 Prediabetes: Secondary | ICD-10-CM | POA: Diagnosis not present

## 2023-04-27 DIAGNOSIS — I10 Essential (primary) hypertension: Secondary | ICD-10-CM | POA: Diagnosis not present

## 2023-04-27 DIAGNOSIS — E785 Hyperlipidemia, unspecified: Secondary | ICD-10-CM | POA: Diagnosis not present

## 2023-05-04 DIAGNOSIS — I10 Essential (primary) hypertension: Secondary | ICD-10-CM | POA: Diagnosis not present

## 2023-05-04 DIAGNOSIS — R7303 Prediabetes: Secondary | ICD-10-CM | POA: Diagnosis not present

## 2023-05-04 DIAGNOSIS — E785 Hyperlipidemia, unspecified: Secondary | ICD-10-CM | POA: Diagnosis not present

## 2023-05-04 DIAGNOSIS — E039 Hypothyroidism, unspecified: Secondary | ICD-10-CM | POA: Diagnosis not present

## 2023-05-04 DIAGNOSIS — H9313 Tinnitus, bilateral: Secondary | ICD-10-CM | POA: Diagnosis not present

## 2023-05-04 DIAGNOSIS — Z0000 Encounter for general adult medical examination without abnormal findings: Secondary | ICD-10-CM | POA: Diagnosis not present

## 2023-07-28 ENCOUNTER — Ambulatory Visit (INDEPENDENT_AMBULATORY_CARE_PROVIDER_SITE_OTHER): Payer: PRIVATE HEALTH INSURANCE | Admitting: Emergency Medicine

## 2023-07-28 ENCOUNTER — Encounter: Payer: Self-pay | Admitting: Emergency Medicine

## 2023-07-28 ENCOUNTER — Other Ambulatory Visit: Payer: Self-pay | Admitting: Emergency Medicine

## 2023-07-28 ENCOUNTER — Ambulatory Visit (INDEPENDENT_AMBULATORY_CARE_PROVIDER_SITE_OTHER): Payer: PRIVATE HEALTH INSURANCE

## 2023-07-28 VITALS — BP 132/80 | HR 68 | Temp 98.5°F | Ht 65.0 in | Wt 189.1 lb

## 2023-07-28 DIAGNOSIS — M25551 Pain in right hip: Secondary | ICD-10-CM

## 2023-07-28 DIAGNOSIS — K5909 Other constipation: Secondary | ICD-10-CM

## 2023-07-28 DIAGNOSIS — E039 Hypothyroidism, unspecified: Secondary | ICD-10-CM | POA: Diagnosis not present

## 2023-07-28 DIAGNOSIS — H9311 Tinnitus, right ear: Secondary | ICD-10-CM | POA: Insufficient documentation

## 2023-07-28 DIAGNOSIS — E785 Hyperlipidemia, unspecified: Secondary | ICD-10-CM

## 2023-07-28 DIAGNOSIS — M25552 Pain in left hip: Secondary | ICD-10-CM

## 2023-07-28 DIAGNOSIS — G8929 Other chronic pain: Secondary | ICD-10-CM | POA: Diagnosis not present

## 2023-07-28 DIAGNOSIS — R0989 Other specified symptoms and signs involving the circulatory and respiratory systems: Secondary | ICD-10-CM | POA: Insufficient documentation

## 2023-07-28 DIAGNOSIS — Z1211 Encounter for screening for malignant neoplasm of colon: Secondary | ICD-10-CM

## 2023-07-28 DIAGNOSIS — I10 Essential (primary) hypertension: Secondary | ICD-10-CM | POA: Diagnosis not present

## 2023-07-28 LAB — CBC WITH DIFFERENTIAL/PLATELET
Basophils Absolute: 0 10*3/uL (ref 0.0–0.1)
Basophils Relative: 0.4 % (ref 0.0–3.0)
Eosinophils Absolute: 0.3 10*3/uL (ref 0.0–0.7)
Eosinophils Relative: 3.6 % (ref 0.0–5.0)
HCT: 40.9 % (ref 36.0–46.0)
Hemoglobin: 12.9 g/dL (ref 12.0–15.0)
Lymphocytes Relative: 28.3 % (ref 12.0–46.0)
Lymphs Abs: 2.5 10*3/uL (ref 0.7–4.0)
MCHC: 31.6 g/dL (ref 30.0–36.0)
MCV: 82.9 fl (ref 78.0–100.0)
Monocytes Absolute: 0.5 10*3/uL (ref 0.1–1.0)
Monocytes Relative: 6.2 % (ref 3.0–12.0)
Neutro Abs: 5.4 10*3/uL (ref 1.4–7.7)
Neutrophils Relative %: 61.5 % (ref 43.0–77.0)
Platelets: 280 10*3/uL (ref 150.0–400.0)
RBC: 4.94 Mil/uL (ref 3.87–5.11)
RDW: 14 % (ref 11.5–15.5)
WBC: 8.8 10*3/uL (ref 4.0–10.5)

## 2023-07-28 LAB — COMPREHENSIVE METABOLIC PANEL WITH GFR
ALT: 17 U/L (ref 0–35)
AST: 20 U/L (ref 0–37)
Albumin: 4.5 g/dL (ref 3.5–5.2)
Alkaline Phosphatase: 85 U/L (ref 39–117)
BUN: 14 mg/dL (ref 6–23)
CO2: 30 meq/L (ref 19–32)
Calcium: 9.4 mg/dL (ref 8.4–10.5)
Chloride: 103 meq/L (ref 96–112)
Creatinine, Ser: 0.87 mg/dL (ref 0.40–1.20)
GFR: 69.67 mL/min (ref >60.00)
Glucose, Bld: 95 mg/dL (ref 70–99)
Potassium: 3.6 meq/L (ref 3.5–5.1)
Sodium: 139 meq/L (ref 135–145)
Total Bilirubin: 0.4 mg/dL (ref 0.2–1.2)
Total Protein: 7.7 g/dL (ref 6.0–8.3)

## 2023-07-28 LAB — LIPID PANEL
Cholesterol: 221 mg/dL — ABNORMAL HIGH (ref 0–200)
HDL: 47.2 mg/dL (ref >39.00)
LDL Cholesterol: 139 mg/dL — ABNORMAL HIGH (ref 0–99)
NonHDL: 173.87
Total CHOL/HDL Ratio: 5
Triglycerides: 176 mg/dL — ABNORMAL HIGH (ref 0.0–149.0)
VLDL: 35.2 mg/dL (ref 0.0–40.0)

## 2023-07-28 LAB — TSH: TSH: 2.14 u[IU]/mL (ref 0.35–5.50)

## 2023-07-28 LAB — HEMOGLOBIN A1C: Hgb A1c MFr Bld: 5.9 % (ref 4.6–6.5)

## 2023-07-28 MED ORDER — ROSUVASTATIN CALCIUM 10 MG PO TABS: 10.0000 mg | ORAL_TABLET | Freq: Every day | ORAL | 3 refills | Status: DC

## 2023-07-28 NOTE — Assessment & Plan Note (Addendum)
Chronic chest congestion for 6 months Normal physical exam Chest x-ray done today.  Report reviewed.  Normal x-ray. No concerns.

## 2023-07-28 NOTE — Assessment & Plan Note (Signed)
Mild and not affecting quality of life too much Mild degenerative changes seen on x-rays done today. Pain management discussed May take Tylenol and or Advil as needed

## 2023-07-28 NOTE — Assessment & Plan Note (Signed)
Clinically euthyroid.  TSH done today. Continue levothyroxine 50 mcg daily.

## 2023-07-28 NOTE — Assessment & Plan Note (Signed)
Diet and nutrition discussed Advised to stay well-hydrated and increase amount of fiber in her diet Needs colonoscopy GI referral placed today

## 2023-07-28 NOTE — Assessment & Plan Note (Signed)
BP Readings from Last 3 Encounters:  07/28/23 132/80  02/21/20 98/82  02/13/20 140/65  Well-controlled hypertension Continue losartan 50 mg daily Cardiovascular risks associated with hypertension discussed Dietary approaches to stop hypertension discussed

## 2023-07-28 NOTE — Patient Instructions (Signed)
Health Maintenance After Age 65 After age 65, you are at a higher risk for certain long-term diseases and infections as well as injuries from falls. Falls are a major cause of broken bones and head injuries in people who are older than age 65. Getting regular preventive care can help to keep you healthy and well. Preventive care includes getting regular testing and making lifestyle changes as recommended by your health care provider. Talk with your health care provider about: Which screenings and tests you should have. A screening is a test that checks for a disease when you have no symptoms. A diet and exercise plan that is right for you. What should I know about screenings and tests to prevent falls? Screening and testing are the best ways to find a health problem early. Early diagnosis and treatment give you the best chance of managing medical conditions that are common after age 65. Certain conditions and lifestyle choices may make you more likely to have a fall. Your health care provider may recommend: Regular vision checks. Poor vision and conditions such as cataracts can make you more likely to have a fall. If you wear glasses, make sure to get your prescription updated if your vision changes. Medicine review. Work with your health care provider to regularly review all of the medicines you are taking, including over-the-counter medicines. Ask your health care provider about any side effects that may make you more likely to have a fall. Tell your health care provider if any medicines that you take make you feel dizzy or sleepy. Strength and balance checks. Your health care provider may recommend certain tests to check your strength and balance while standing, walking, or changing positions. Foot health exam. Foot pain and numbness, as well as not wearing proper footwear, can make you more likely to have a fall. Screenings, including: Osteoporosis screening. Osteoporosis is a condition that causes  the bones to get weaker and break more easily. Blood pressure screening. Blood pressure changes and medicines to control blood pressure can make you feel dizzy. Depression screening. You may be more likely to have a fall if you have a fear of falling, feel depressed, or feel unable to do activities that you used to do. Alcohol use screening. Using too much alcohol can affect your balance and may make you more likely to have a fall. Follow these instructions at home: Lifestyle Do not drink alcohol if: Your health care provider tells you not to drink. If you drink alcohol: Limit how much you have to: 0-1 drink a day for women. 0-2 drinks a day for men. Know how much alcohol is in your drink. In the [place] one drink equals one 12 oz bottle of beer (355 mL), one 5 oz glass of wine (148 mL), or one 1 oz glass of hard liquor (44 mL). Do not use any products that contain nicotine or tobacco. These products include cigarettes, chewing tobacco, and vaping devices, such as e-cigarettes. If you need help quitting, ask your health care provider. Activity  Follow a regular exercise program to stay fit. This will help you maintain your balance. Ask your health care provider what types of exercise are appropriate for you. If you need a cane or walker, use it as recommended by your health care provider. Wear supportive shoes that have nonskid soles. Safety  Remove any tripping hazards, such as rugs, cords, and clutter. Install safety equipment such as grab bars in bathrooms and safety rails on stairs. Keep rooms and walkways   well-lit. General instructions Talk with your health care provider about your risks for falling. Tell your health care provider if: You fall. Be sure to tell your health care provider about all falls, even ones that seem minor. You feel dizzy, tiredness (fatigue), or off-balance. Take over-the-counter and prescription medicines only as told by your health care provider. These include  supplements. Eat a healthy diet and maintain a healthy weight. A healthy diet includes low-fat dairy products, low-fat (lean) meats, and fiber from whole grains, beans, and lots of fruits and vegetables. Stay current with your vaccines. Schedule regular health, dental, and eye exams. Summary Having a healthy lifestyle and getting preventive care can help to protect your health and wellness after age 65. Screening and testing are the best way to find a health problem early and help you avoid having a fall. Early diagnosis and treatment give you the best chance for managing medical conditions that are more common for people who are older than age 65. Falls are a major cause of broken bones and head injuries in people who are older than age 65. Take precautions to prevent a fall at home. Work with your health care provider to learn what changes you can make to improve your health and wellness and to prevent falls. This information is not intended to replace advice given to you by your health care provider. Make sure you discuss any questions you have with your health care provider. Document Revised: 03/17/2021 Document Reviewed: 03/17/2021 Elsevier Patient Education  2024 [employer]  

## 2023-07-28 NOTE — Progress Notes (Signed)
Misty Pruitt 67 y.o.   Chief Complaint  Patient presents with   New Patient (Initial Visit)    Congestion in her chest for about 6 months, not going away. Hip pain , off and on constipation     HISTORY OF PRESENT ILLNESS: This is a 67 y.o. female first visit to this office, here to establish care with me History of hypertension and hypothyroidism Has the following complaints today: 1.  Chest congestion for 6 months.  Took antibiotics in the beginning.  No change.  Non-smoker.  No history of asthma. 2.  Chronic bilateral hip pain, right more than left.  Not affecting quality of life much. 3.  Chronic constipation.  Requesting colonoscopy 4.  Chronic tinnitus.  Requesting ENT referral. No other complaints or medical concerns today.  HPI   Prior to Admission medications   Medication Sig Start Date End Date Taking? Authorizing Provider  aspirin EC 81 MG tablet Take 81 mg by mouth 3 (three) times a week.   Yes [provider]  b complex vitamins tablet Take 1 tablet by mouth daily with breakfast.   Yes [provider]  CALCIUM PO Take 1 tablet by mouth daily with breakfast.   Yes [provider]  cetirizine (ZYRTEC) 10 MG tablet Take 10 mg by mouth daily as needed for allergies or rhinitis (or "seasonal allergies").   Yes [provider]  Cholecalciferol (VITAMIN D-3 PO) Take 1 capsule by mouth daily with breakfast.   Yes [provider]  FLUoxetine (PROZAC) 40 MG capsule Take 40 mg by mouth daily as needed (for mood).  12/12/19  Yes [provider]  fluticasone (FLONASE) 50 MCG/ACT nasal spray Place 1-2 sprays into both nostrils daily as needed for allergies or rhinitis (or "seasonal allergies").   Yes [provider]  levothyroxine (SYNTHROID) 50 MCG tablet Take 50 mcg by mouth daily before breakfast.   Yes [provider]  multivitamin-lutein (OCUVITE-LUTEIN) CAPS capsule Take 1 capsule by mouth daily.   Yes  [provider]  VITAMIN E PO Take 1 capsule by mouth daily with breakfast.   Yes [provider]  dexamethasone (DECADRON) 6 MG tablet Take 1 tablet (6 mg total) by mouth daily. Patient not taking: Reported on 07/28/2023 02/21/20   Maretta Bees, MD  losartan (COZAAR) 50 MG tablet Take 50 mg by mouth daily.    [provider]    No Known Allergies  Patient Active Problem List   Diagnosis Date Noted   Essential hypertension 02/16/2020   Hypothyroidism 02/16/2020    Past Medical History:  Diagnosis Date   Hypertension    Thyroid disease     Past Surgical History:  Procedure Laterality Date   HYSTERECTOMY ABDOMINAL WITH SALPINGECTOMY      Social History   Socioeconomic History   Marital status: Single    Spouse name: Not on file   Number of children: Not on file   Years of education: Not on file   Highest education level: Not on file  Occupational History   Not on file  Tobacco Use   Smoking status: Never   Smokeless tobacco: Never  Vaping Use   Vaping status: Never Used  Substance and Sexual Activity   Alcohol use: Not on file   Drug use: Not on file   Sexual activity: Not on file  Other Topics Concern   Not on file  Social History Narrative   Not on file   Social Determinants of  Health   Financial Resource Strain: Not on file  Food Insecurity: Not on file  Transportation Needs: Not on file  Physical Activity: Not on file  Stress: Not on file  Social Connections: Not on file  Intimate Partner Violence: Not on file    Family History  Problem Relation Age of Onset   Diabetes Mellitus II Neg Hx      Review of Systems  Constitutional: Negative.  Negative for chills and fever.  HENT: Negative.  Negative for congestion, hearing loss and sore throat.        Right-sided tinnitus  Respiratory: Negative.  Negative for cough and shortness of breath.   Cardiovascular: Negative.  Negative for chest pain and palpitations.   Gastrointestinal:  Positive for constipation. Negative for abdominal pain, diarrhea, nausea and vomiting.  Genitourinary: Negative.  Negative for dysuria and hematuria.  Musculoskeletal:  Positive for joint pain (Bilateral hip).  Skin: Negative.  Negative for rash.  Neurological: Negative.  Negative for dizziness and headaches.  All other systems reviewed and are negative.   Today's Vitals   07/28/23 1304  BP: 132/80  Pulse: 68  Temp: 98.5 F (36.9 C)  TempSrc: Oral  SpO2: 95%  Weight: 189 lb 2 oz (85.8 kg)  Height: 5\' 5"  (1.651 m)   Body mass index is 31.47 kg/m.   Physical Exam Vitals reviewed.  HENT:     Head: Normocephalic.     Right Ear: Tympanic membrane, ear canal and external ear normal.     Left Ear: Tympanic membrane, ear canal and external ear normal.     Mouth/Throat:     Mouth: Mucous membranes are moist.     Pharynx: Oropharynx is clear.  Eyes:     Extraocular Movements: Extraocular movements intact.     Conjunctiva/sclera: Conjunctivae normal.     Pupils: Pupils are equal, round, and reactive to light.  Cardiovascular:     Rate and Rhythm: Normal rate and regular rhythm.     Pulses: Normal pulses.     Heart sounds: Normal heart sounds.  Pulmonary:     Effort: Pulmonary effort is normal.     Breath sounds: Normal breath sounds.  Abdominal:     Palpations: Abdomen is soft.     Tenderness: There is no abdominal tenderness.  Musculoskeletal:     Cervical back: No tenderness.  Lymphadenopathy:     Cervical: No cervical adenopathy.  Skin:    General: Skin is warm and dry.     Capillary Refill: Capillary refill takes less than 2 seconds.  Neurological:     General: No focal deficit present.     Mental Status: She is alert and oriented to person, place, and time.  Psychiatric:        Mood and Affect: Mood normal.        Behavior: Behavior normal.    DG HIPS BILAT WITH PELVIS MIN 5 VIEWS  Result Date: 07/28/2023 CLINICAL DATA:  Chronic bilateral  hip pain for 2 years without known injury. EXAM: DG HIP (WITH OR WITHOUT PELVIS) 5+V BILAT COMPARISON:  None Available. FINDINGS: There is no evidence of hip fracture or dislocation. No significant joint space narrowing is noted. Mild osteophyte formation is noted bilaterally. 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 No acute abnormality seen. Electronically Signed   By: Lupita Raider M.D.   On: 07/28/2023 14:52   DG Chest 2 View  Result Date: 07/28/2023 CLINICAL DATA:  Chest congestion for 6 months.  EXAM: CHEST - 2 VIEW COMPARISON:  February 20, 2020. FINDINGS: The heart size and mediastinal contours are within normal limits. Both lungs are clear. The visualized skeletal structures are unremarkable. IMPRESSION: No active cardiopulmonary disease. Electronically Signed   By: Lupita Raider M.D.   On: 07/28/2023 14:50     ASSESSMENT & PLAN: A total of 48 minutes was spent with the patient and counseling/coordination of care regarding preparing for this visit, review of available medical records, establishing care with me, comprehensive history and physical examination, review of multiple chronic medical conditions under management, review of all medications, cardiovascular risks associated with hypertension, education on nutrition, prognosis, documentation, and need for follow-up.  Problem List Items Addressed This Visit       Cardiovascular and Mediastinum   Essential hypertension - Primary    BP Readings from Last 3 Encounters:  07/28/23 132/80  02/21/20 98/82  02/13/20 140/65  Well-controlled hypertension Continue losartan 50 mg daily Cardiovascular risks associated with hypertension discussed Dietary approaches to stop hypertension discussed       Relevant Medications   losartan (COZAAR) 50 MG tablet   Other Relevant Orders   CBC with Differential/Platelet (Completed)   Comprehensive metabolic panel (Completed)   Hemoglobin A1c (Completed)   Lipid  panel (Completed)     Respiratory   Chest congestion    Chronic chest congestion for 6 months Normal physical exam Chest x-ray done today.  Report reviewed.  Normal x-ray. No concerns.      Relevant Orders   DG Chest 2 View (Completed)     Digestive   Chronic constipation    Diet and nutrition discussed Advised to stay well-hydrated and increase amount of fiber in her diet Needs colonoscopy GI referral placed today      Relevant Orders   Ambulatory referral to Gastroenterology     Endocrine   Hypothyroidism    Clinically euthyroid TSH done today Continue levothyroxine 50 mcg daily      Relevant Orders   TSH (Completed)     Other   Chronic pain of both hips    Mild and not affecting quality of life too much Mild degenerative changes seen on x-rays done today. Pain management discussed May take Tylenol and or Advil as needed      Relevant Orders   DG HIPS BILAT WITH PELVIS MIN 5 VIEWS (Completed)   Tinnitus of right ear    Normal exam Not affecting hearing Recommend ENT evaluation Referral placed today      Relevant Orders   Ambulatory referral to ENT   Other Visit Diagnoses     Screening for colon cancer       Relevant Orders   Ambulatory referral to Gastroenterology      Patient Instructions  Health Maintenance After Age 87 After age 62, you are at a higher risk for certain long-term diseases and infections as well as injuries from falls. Falls are a major cause of broken bones and head injuries in people who are older than age 73. Getting regular preventive care can help to keep you healthy and well. Preventive care includes getting regular testing and making lifestyle changes as recommended by your health care provider. Talk with your health care provider about: Which screenings and tests you should have. A screening is a test that checks for a disease when you have no symptoms. A diet and exercise plan that is right for you. What should I know  about screenings and tests to prevent falls?  Screening and testing are the best ways to find a health problem early. Early diagnosis and treatment give you the best chance of managing medical conditions that are common after age 8. Certain conditions and lifestyle choices may make you more likely to have a fall. Your health care provider may recommend: Regular vision checks. Poor vision and conditions such as cataracts can make you more likely to have a fall. If you wear glasses, make sure to get your prescription updated if your vision changes. Medicine review. Work with your health care provider to regularly review all of the medicines you are taking, including over-the-counter medicines. Ask your health care provider about any side effects that may make you more likely to have a fall. Tell your health care provider if any medicines that you take make you feel dizzy or sleepy. Strength and balance checks. Your health care provider may recommend certain tests to check your strength and balance while standing, walking, or changing positions. Foot health exam. Foot pain and numbness, as well as not wearing proper footwear, can make you more likely to have a fall. Screenings, including: Osteoporosis screening. Osteoporosis is a condition that causes the bones to get weaker and break more easily. Blood pressure screening. Blood pressure changes and medicines to control blood pressure can make you feel dizzy. Depression screening. You may be more likely to have a fall if you have a fear of falling, feel depressed, or feel unable to do activities that you used to do. Alcohol use screening. Using too much alcohol can affect your balance and may make you more likely to have a fall. Follow these instructions at home: Lifestyle Do not drink alcohol if: Your health care provider tells you not to drink. If you drink alcohol: Limit how much you have to: 0-1 drink a day for women. 0-2 drinks a day for  men. Know how much alcohol is in your drink. In the [place] one drink equals one 12 oz bottle of beer (355 mL), one 5 oz glass of wine (148 mL), or one 1 oz glass of hard liquor (44 mL). Do not use any products that contain nicotine or tobacco. These products include cigarettes, chewing tobacco, and vaping devices, such as e-cigarettes. If you need help quitting, ask your health care provider. Activity  Follow a regular exercise program to stay fit. This will help you maintain your balance. Ask your health care provider what types of exercise are appropriate for you. If you need a cane or walker, use it as recommended by your health care provider. Wear supportive shoes that have nonskid soles. Safety  Remove any tripping hazards, such as rugs, cords, and clutter. Install safety equipment such as grab bars in bathrooms and safety rails on stairs. Keep rooms and walkways well-lit. General instructions Talk with your health care provider about your risks for falling. Tell your health care provider if: You fall. Be sure to tell your health care provider about all falls, even ones that seem minor. You feel dizzy, tiredness (fatigue), or off-balance. Take over-the-counter and prescription medicines only as told by your health care provider. These include supplements. Eat a healthy diet and maintain a healthy weight. A healthy diet includes low-fat dairy products, low-fat (lean) meats, and fiber from whole grains, beans, and lots of fruits and vegetables. Stay current with your vaccines. Schedule regular health, dental, and eye exams. Summary Having a healthy lifestyle and getting preventive care can help to protect your health and wellness after age 53.  Screening and testing are the best way to find a health problem early and help you avoid having a fall. Early diagnosis and treatment give you the best chance for managing medical conditions that are more common for people who are older than age  38. Falls are a major cause of broken bones and head injuries in people who are older than age 28. Take precautions to prevent a fall at home. Work with your health care provider to learn what changes you can make to improve your health and wellness and to prevent falls. This information is not intended to replace advice given to you by your health care provider. Make sure you discuss any questions you have with your health care provider. Document Revised: 03/17/2021 Document Reviewed: 03/17/2021 Elsevier Patient Education  2024 [employer]vier Inc.     Edwina Barth, MD [facility]

## 2023-07-28 NOTE — Assessment & Plan Note (Signed)
Normal exam Not affecting hearing Recommend ENT evaluation Referral placed today

## 2023-09-28 ENCOUNTER — Encounter: Payer: Self-pay | Admitting: Gastroenterology

## 2023-12-15 ENCOUNTER — Ambulatory Visit: Payer: Self-pay | Admitting: Internal Medicine

## 2023-12-15 NOTE — Telephone Encounter (Signed)
 Copied from CRM [id number]. Topic: Clinical - Red Word Triage >> Dec 15, 2023 12:59 PM Carmell R wrote: Red Word that prompted transfer to Nurse Triage: Diarrhea, nausea, chills, Left shoulder area (neck and back) cannot move it. She's in pain 7/10. Been for the past 4 days  Chief Complaint: diarrhea, nausea, chills Symptoms: diarrhea, nausea, chills, was vomiting but that has stopped. Frequency: constant, using antidiarrhea medications. Pertinent Negatives: Patient denies numbness, fever, sob Disposition: [] ED /[] Urgent Care (no appt availability in office) / [x] Appointment(In office/virtual)/ []  [facility]/ [] Home Care/ [] Refused Recommended Disposition /[] [facility]/ []  Follow-up with PCP Additional Notes: Apt made for tomorrow.  [facility]structed to [facility] to [facility] if becomes worse.  Care advice given, denies questions.  Reason for Disposition  [1] SEVERE diarrhea (e.g., 7 or more times / day more than normal) AND [2] present > 24 hours (1 day)  Answer Assessment - Initial Assessment Questions 1. DIARRHEA SEVERITY: How bad is the diarrhea? How many more stools have you had in the past 24 hours than normal?    - NO DIARRHEA (SCALE 0)   - MILD (SCALE 1-3): Few loose or mushy BMs; increase of 1-3 stools over normal daily number of stools; mild increase in ostomy output.   -  MODERATE (SCALE 4-7): Increase of 4-6 stools daily over normal; moderate increase in ostomy output.   -  SEVERE (SCALE 8-10; OR WORST POSSIBLE): Increase of 7 or more stools daily over normal; moderate increase in ostomy output; incontinence.     5 times 2. ONSET: When did the diarrhea begin?      A week ago  3. BM CONSISTENCY: How loose or watery is the diarrhea?      It was water at first 4. VOMITING: Are you also vomiting? If Yes, ask: How many times in the past 24 hours?      Yes, vomited 3 x 5. ABDOMEN PAIN: Are you having any abdomen pain? If Yes, ask: What does it feel like? (e.g.,  crampy, dull, intermittent, constant)      Coming and going 6. ABDOMEN PAIN SEVERITY: If present, ask: How bad is the pain?  (e.g., Scale 1-10; mild, moderate, or severe)   - MILD (1-3): doesn't interfere with normal activities, abdomen soft and not tender to touch    - MODERATE (4-7): interferes with normal activities or awakens from sleep, abdomen tender to touch    - SEVERE (8-10): excruciating pain, doubled over, unable to do any normal activities       5-6/10 7. ORAL INTAKE: If vomiting, Have you been able to drink liquids? How much liquids have you had in the past 24 hours?     A little bit 8. HYDRATION: Any signs of dehydration? (e.g., dry mouth [not just dry lips], too weak to stand, dizziness, new weight loss) When did you last urinate?     Mouth is dry, dizzy 9. EXPOSURE: Have you traveled to a foreign country recently? Have you been exposed to anyone with diarrhea? Could you have eaten any food that was spoiled?     na 10. ANTIBIOTIC USE: Are you taking antibiotics now or have you taken antibiotics in the past 2 months?       denies 11. OTHER SYMPTOMS: Do you have any other symptoms? (e.g., fever, blood in stool)       denies  Protocols used: Diarrhea-A-AH

## 2023-12-16 ENCOUNTER — Encounter: Payer: Self-pay | Admitting: Emergency Medicine

## 2023-12-16 ENCOUNTER — Ambulatory Visit: Payer: PRIVATE HEALTH INSURANCE

## 2023-12-16 ENCOUNTER — Ambulatory Visit (INDEPENDENT_AMBULATORY_CARE_PROVIDER_SITE_OTHER): Payer: Medicare Other | Admitting: Emergency Medicine

## 2023-12-16 VITALS — BP 138/90 | HR 69 | Temp 98.3°F | Ht 65.0 in | Wt 183.0 lb

## 2023-12-16 DIAGNOSIS — E039 Hypothyroidism, unspecified: Secondary | ICD-10-CM | POA: Diagnosis not present

## 2023-12-16 DIAGNOSIS — A09 Infectious gastroenteritis and colitis, unspecified: Secondary | ICD-10-CM

## 2023-12-16 DIAGNOSIS — I10 Essential (primary) hypertension: Secondary | ICD-10-CM

## 2023-12-16 DIAGNOSIS — M25512 Pain in left shoulder: Secondary | ICD-10-CM | POA: Diagnosis not present

## 2023-12-16 DIAGNOSIS — M19012 Primary osteoarthritis, left shoulder: Secondary | ICD-10-CM | POA: Diagnosis not present

## 2023-12-16 DIAGNOSIS — G8929 Other chronic pain: Secondary | ICD-10-CM | POA: Insufficient documentation

## 2023-12-16 MED ORDER — MELOXICAM 15 MG PO TABS: 15.0000 mg | ORAL_TABLET | Freq: Every day | ORAL | 0 refills | Status: AC

## 2023-12-16 NOTE — Assessment & Plan Note (Signed)
 Active and affecting quality of life.  Recent onset.  No injuries. Very limited range of motion on physical exam. Recommend x-ray today. Meloxicam  15 mg as needed for pain Recommend orthopedic evaluation Referral to sports medicine placed today

## 2023-12-16 NOTE — Patient Instructions (Signed)
 Diarrhea, Adult Diarrhea is when you pass loose and sometimes watery poop (stool) often. Diarrhea can make you feel weak and cause you to lose water in your body (get dehydrated). Losing water in your body can cause you to: Feel tired and thirsty. Have a dry mouth. Go pee (urinate) less often. Diarrhea often lasts 2-3 days. It can last longer if it is a sign of something more serious. Be sure to treat your diarrhea as told by your doctor. Follow these instructions at home: Eating and drinking     Follow these instructions as told by your doctor: Take an ORS (oral rehydration solution). This is a drink that helps you replace fluids and minerals your body lost. It is sold at pharmacies and stores. Drink enough fluid to keep your pee (urine) pale yellow. Drink fluids such as: Water. You can also get fluids by sucking on ice chips. Diluted fruit juice. Low-calorie sports drinks. Milk. Avoid drinking fluids that have a lot of sugar or caffeine in them. These include soda, energy drinks, and regular sports drinks. Avoid alcohol. Eat bland, easy-to-digest foods in small amounts as you are able. These foods include: Bananas. Applesauce. Rice. Low-fat (lean) meats. Toast. Crackers. Avoid spicy or fatty foods.  Medicines Take over-the-counter and prescription medicines only as told by your doctor. If you were prescribed antibiotics, take them as told by your doctor. Do not stop taking them even if you start to feel better. General instructions  Wash your hands often using soap and water for 20 seconds. If soap and water are not available, use hand sanitizer. Others in your home should wash their hands as well. Wash your hands: After using the toilet or changing a diaper. Before preparing, cooking, or serving food. While caring for a sick person. While visiting someone in a hospital. Rest at home while you get better. Take a warm bath to help with any burning or pain from having  diarrhea. Watch your condition for any changes. Contact a doctor if: You have a fever. Your diarrhea gets worse. You have new symptoms. You vomit every time you eat or drink. You feel light-headed, dizzy, or you have a headache. You have muscle cramps. You have signs of losing too much water in your body, such as: Dark pee, very little pee, or no pee. Cracked lips. Dry mouth. Sunken eyes. Sleepiness. Weakness. You have bloody or black poop or poop that looks like tar. You have very bad pain, cramping, or bloating in your belly (abdomen). Your skin feels cold and clammy. You feel confused. Get help right away if: You have chest pain. Your heart is beating very quickly. You have trouble breathing or you are breathing very quickly. You feel very weak or you faint. These symptoms may be an emergency. Get help right away. Call 911. Do not wait to see if the symptoms will go away. Do not drive yourself to the hospital. This information is not intended to replace advice given to you by your health care provider. Make sure you discuss any questions you have with your health care provider. Document Revised: 04/14/2022 Document Reviewed: 04/14/2022 Elsevier Patient Education  2024 [employer].

## 2023-12-16 NOTE — Assessment & Plan Note (Signed)
 Well-controlled hypertension Continue losartan 50 mg daily. Cardiovascular risks associated with hypertension discussed Dietary approaches to stop hypertension discussed

## 2023-12-16 NOTE — Assessment & Plan Note (Signed)
 Clinically stable.  Running its course.  However has symptoms of dehydration. Recommend blood work today Stool cultures today Advised to rest and stay well-hydrated BRAT diet May continue Imodium as needed for diarrhea

## 2023-12-16 NOTE — Assessment & Plan Note (Signed)
Clinically euthyroid.  TSH done today. Continue levothyroxine 50 mcg daily. 

## 2023-12-16 NOTE — Progress Notes (Signed)
 Misty Pruitt 68 y.o.   Chief Complaint  Patient presents with   Diarrhea    Patient states been going on for 5 days now. Does have some stomach pain. Pt is drinking hydrolyte and imodium. States the imodium only helps for a little bit. Patient states she's been eating rice, bread, and things to help there stool. She also c/o of left shoulder pain that has been over a month has been using creams and advil and only helps temporary     HISTORY OF PRESENT ILLNESS: This is a 68 y.o. female complaining of watery nonbloody diarrhea that started about 5 days ago.  Starting to feel better but still feeling weak Had nausea and vomiting but no longer.  Occasional cramping abdominal pain. Also complaining of left shoulder pain that started about 1 month ago.  Denies injury.  Unable to lift her up. No other complaints or medical concerns today  Diarrhea  Pertinent negatives include no chills, coughing, fever, headaches or vomiting.     Prior to Admission medications   Medication Sig Start Date End Date Taking? Authorizing Provider  aspirin  EC 81 MG tablet Take 81 mg by mouth 3 (three) times a week.   Yes [provider]  b complex vitamins tablet Take 1 tablet by mouth daily with breakfast.   Yes [provider]  CALCIUM  PO Take 1 tablet by mouth daily with breakfast.   Yes [provider]  cetirizine (ZYRTEC) 10 MG tablet Take 10 mg by mouth daily as needed for allergies or rhinitis (or seasonal allergies).   Yes [provider]  Cholecalciferol (VITAMIN D-3 PO) Take 1 capsule by mouth daily with breakfast.   Yes [provider]  FLUoxetine  (PROZAC ) 40 MG capsule Take 40 mg by mouth daily as needed (for mood).  12/12/19  Yes [provider]  fluticasone (FLONASE) 50 MCG/ACT nasal spray Place 1-2 sprays into both nostrils daily as needed for allergies or rhinitis (or seasonal allergies).   Yes [provider]  levothyroxine   (SYNTHROID ) 50 MCG tablet Take 50 mcg by mouth daily before breakfast.   Yes [provider]  losartan (COZAAR) 50 MG tablet Take 50 mg by mouth daily.   Yes [provider]  multivitamin-lutein (OCUVITE-LUTEIN) CAPS capsule Take 1 capsule by mouth daily.   Yes [provider]  rosuvastatin  (CRESTOR ) 10 MG tablet Take 1 tablet (10 mg total) by mouth daily. 07/28/23  Yes Aram Domzalski Jose, MD  VITAMIN E PO Take 1 capsule by mouth daily with breakfast.   Yes [provider]  dexamethasone  (DECADRON ) 6 MG tablet Take 1 tablet (6 mg total) by mouth daily. Patient not taking: Reported on 12/16/2023 02/21/20   Raenelle Donalda HERO, MD    No Known Allergies  Patient Active Problem List   Diagnosis Date Noted   Chronic pain of both hips 07/28/2023   Chest congestion 07/28/2023   Chronic constipation 07/28/2023   Tinnitus of right ear 07/28/2023   Essential hypertension 02/16/2020   Hypothyroidism 02/16/2020    Past Medical History:  Diagnosis Date   Hypertension    Thyroid  disease     Past Surgical History:  Procedure Laterality Date   HYSTERECTOMY ABDOMINAL WITH SALPINGECTOMY      Social History   Socioeconomic History   Marital status: Single    Spouse name: Not on file   Number of children: Not on file   Years of education: Not on file   Highest education level: Not  on file  Occupational History   Not on file  Tobacco Use   Smoking status: Never   Smokeless tobacco: Never  Vaping Use   Vaping status: Never Used  Substance and Sexual Activity   Alcohol use: Not on file   Drug use: Not on file   Sexual activity: Not on file  Other Topics Concern   Not on file  Social History Narrative   Not on file   Social Drivers of Health   Financial Resource Strain: Not on file  Food Insecurity: Not on file  Transportation Needs: Not on file  Physical Activity: Not on file  Stress: Not on file  Social Connections: Not on file  Intimate  Partner Violence: Not on file    Family History  Problem Relation Age of Onset   Diabetes Mellitus II Neg Hx      Review of Systems  Constitutional: Negative.  Negative for chills and fever.  HENT: Negative.  Negative for congestion and sore throat.   Respiratory: Negative.  Negative for cough and shortness of breath.   Cardiovascular: Negative.  Negative for chest pain and palpitations.  Gastrointestinal:  Positive for diarrhea. Negative for nausea and vomiting.  Genitourinary: Negative.   Musculoskeletal:  Positive for joint pain (Left shoulder).  Skin: Negative.  Negative for rash.  Neurological: Negative.  Negative for dizziness and headaches.  All other systems reviewed and are negative.   Vitals:   12/16/23 1409  BP: (!) 138/90  Pulse: 69  Temp: 98.3 F (36.8 C)  SpO2: 99%    Physical Exam Constitutional:      Appearance: Normal appearance.  HENT:     Head: Normocephalic.     Mouth/Throat:     Mouth: Mucous membranes are moist.     Pharynx: Oropharynx is clear.  Eyes:     Extraocular Movements: Extraocular movements intact.     Pupils: Pupils are equal, round, and reactive to light.  Cardiovascular:     Rate and Rhythm: Normal rate and regular rhythm.     Pulses: Normal pulses.     Heart sounds: Normal heart sounds.  Pulmonary:     Effort: Pulmonary effort is normal.     Breath sounds: Normal breath sounds.  Abdominal:     Palpations: Abdomen is soft.     Tenderness: There is no abdominal tenderness.  Musculoskeletal:     Cervical back: No tenderness.  Lymphadenopathy:     Cervical: No cervical adenopathy.  Skin:    General: Skin is warm and dry.  Neurological:     General: No focal deficit present.     Mental Status: She is alert and oriented to person, place, and time.  Psychiatric:        Mood and Affect: Mood normal.        Behavior: Behavior normal.    DG Shoulder Left Result Date: 12/16/2023 CLINICAL DATA:  Left shoulder pain EXAM: LEFT  SHOULDER - 2+ VIEW COMPARISON:  None Available. FINDINGS: No fracture or malalignment. Mild AC joint degenerative change. Left apex is clear IMPRESSION: Mild AC joint degenerative change. Electronically Signed   By: Luke Bun M.D.   On: 12/16/2023 15:41     ASSESSMENT & PLAN: A total of 45 minutes was spent with the patient and counseling/coordination of care regarding preparing for this visit, review of most recent office visit notes, review of multiple chronic medical conditions and their management, review of all medications, differential diagnosis of left shoulder pain and need  for orthopedic evaluation, review of x-ray done today, pain management, management of diarrhea, review of most recent bloodwork results, review of health maintenance items, education on nutrition, prognosis, documentation, and need for follow up.   Problem List Items Addressed This Visit       Cardiovascular and Mediastinum   Essential hypertension   Well-controlled hypertension Continue losartan 50 mg daily Cardiovascular risks associated with hypertension discussed Dietary approaches to stop hypertension discussed        Digestive   Infectious diarrhea - Primary   Clinically stable.  Running its course.  However has symptoms of dehydration. Recommend blood work today Stool cultures today Advised to rest and stay well-hydrated BRAT diet May continue Imodium as needed for diarrhea      Relevant Orders   CBC with Differential/Platelet   Comprehensive metabolic panel   Hemoglobin A1c   Lipid panel   Gastrointestinal Panel by PCR , Stool     Endocrine   Hypothyroidism   Clinically euthyroid TSH done today Continue levothyroxine  50 mcg daily      Relevant Orders   TSH     Other   Acute pain of left shoulder   Active and affecting quality of life.  Recent onset.  No injuries. Very limited range of motion on physical exam. Recommend x-ray today. Meloxicam  15 mg as needed for  pain Recommend orthopedic evaluation Referral to sports medicine placed today      Relevant Medications   meloxicam  (MOBIC ) 15 MG tablet   Other Relevant Orders   Ambulatory referral to Sports Medicine   DG Shoulder Left   Patient Instructions  Diarrhea, Adult Diarrhea is when you pass loose and sometimes watery poop (stool) often. Diarrhea can make you feel weak and cause you to lose water in your body (get dehydrated). Losing water in your body can cause you to: Feel tired and thirsty. Have a dry mouth. Go pee (urinate) less often. Diarrhea often lasts 2-3 days. It can last longer if it is a sign of something more serious. Be sure to treat your diarrhea as told by your doctor. Follow these instructions at home: Eating and drinking     Follow these instructions as told by your doctor: Take an ORS (oral rehydration solution). This is a drink that helps you replace fluids and minerals your body lost. It is sold at pharmacies and stores. Drink enough fluid to keep your pee (urine) pale yellow. Drink fluids such as: Water. You can also get fluids by sucking on ice chips. Diluted fruit juice. Low-calorie sports drinks. Milk. Avoid drinking fluids that have a lot of sugar or caffeine in them. These include soda, energy drinks, and regular sports drinks. Avoid alcohol. Eat bland, easy-to-digest foods in small amounts as you are able. These foods include: Bananas. Applesauce. Rice. Low-fat (lean) meats. Toast. Crackers. Avoid spicy or fatty foods.  Medicines Take over-the-counter and prescription medicines only as told by your doctor. If you were prescribed antibiotics, take them as told by your doctor. Do not stop taking them even if you start to feel better. General instructions  Wash your hands often using soap and water for 20 seconds. If soap and water are not available, use hand sanitizer. Others in your home should wash their hands as well. Wash your hands: After using  the toilet or changing a diaper. Before preparing, cooking, or serving food. While caring for a sick person. While visiting someone in a hospital. Rest at home while you get better. Take  a warm bath to help with any burning or pain from having diarrhea. Watch your condition for any changes. Contact a doctor if: You have a fever. Your diarrhea gets worse. You have new symptoms. You vomit every time you eat or drink. You feel light-headed, dizzy, or you have a headache. You have muscle cramps. You have signs of losing too much water in your body, such as: Dark pee, very little pee, or no pee. Cracked lips. Dry mouth. Sunken eyes. Sleepiness. Weakness. You have bloody or black poop or poop that looks like tar. You have very bad pain, cramping, or bloating in your belly (abdomen). Your skin feels cold and clammy. You feel confused. Get help right away if: You have chest pain. Your heart is beating very quickly. You have trouble breathing or you are breathing very quickly. You feel very weak or you faint. These symptoms may be an emergency. Get help right away. Call 911. Do not wait to see if the symptoms will go away. Do not drive yourself to the hospital. This information is not intended to replace advice given to you by your health care provider. Make sure you discuss any questions you have with your health care provider. Document Revised: 04/14/2022 Document Reviewed: 04/14/2022 Elsevier Patient Education  2024 Elsevier Inc.    Emil Schaumann, MD [facility]

## 2023-12-17 NOTE — Progress Notes (Signed)
 Joanna Muck, PhD, LAT, ATC acting as a scribe for Garlan Juniper, MD.  Misty Pruitt is a 68 y.o. female who presents to [facility] at [facility] today for L shoulder pain ongoing for almost 2 month. Pt locates pain to L side of he neck, scapular region, and all over the L shoulder. .  In [place] she had what sounds like a PRP injection which helped some few months ago.  Radiates: yes- shooting pain to L elbow Aggravates: overhead, any movement Treatments tried: creams, IBU, tumeric  Dx imaging: 12/16/23 L shoulder XR  Pertinent review of systems: No fevers or chills  Relevant historical information: Hypertension   Exam:  BP (!) 146/88   Pulse 74   Ht 5\' 5"  (1.651 m)   Wt 183 lb (83 kg)   SpO2 99%   BMI 30.45 kg/m  General: Well Developed, well nourished, and in no acute distress.   MSK: Left shoulder normal-appearing Mildly tender palpation. Passive range of motion full abduction and external rotation. Active range of motion limited to 90 degrees forward flexion and abduction.  Normal functional internal rotation and external rotation active. Strength limited abduction and external rotation 4/5.  Normal internal rotation strength 5/5. Positive Hawkins and Neer's test.  Positive empty can test. Negative Yergason's and speeds test. Pulses capillary fill and sensation are intact distally.    Lab and Radiology Results No results found for this or any previous visit (from the past 72 hours). DG Shoulder Left Result Date: 12/16/2023 CLINICAL DATA:  Left shoulder pain EXAM: LEFT SHOULDER - 2+ VIEW COMPARISON:  None Available. FINDINGS: No fracture or malalignment. Mild AC joint degenerative change. Left apex is clear IMPRESSION: Mild AC joint degenerative change. Electronically Signed   By: Esmeralda Hedge M.D.   On: 12/16/2023 15:41    Procedure: Real-time Ultrasound Guided Injection of shoulder subacromial bursa Device: Philips Affiniti 50G/GE  Logiq Images permanently stored and available for review in PACS Rotator cuff tendons are intact however patient does have a large subacromial subdeltoid bursitis prior to injection. Verbal informed consent obtained.  Discussed risks and benefits of procedure. Warned about infection, bleeding, hyperglycemia damage to structures among others. Patient expresses understanding and agreement Time-out conducted.   Noted no overlying erythema, induration, or other signs of local infection.   Skin prepped in a sterile fashion.   Local anesthesia: Topical Ethyl chloride.   With sterile technique and under real time ultrasound guidance: 40 mg of Kenalog and 2 mL of Marcaine injected into subacromial/subdeltoid bursa. Fluid seen entering the bursa.   Completed without difficulty   Pain immediately resolved suggesting accurate placement of the medication.   Advised to call if fevers/chills, erythema, induration, drainage, or persistent bleeding.   Images permanently stored and available for review in the ultrasound unit.  Impression: Technically successful ultrasound guided injection.       Assessment and Plan: 68 y.o. female with left shoulder pain chronic. Pain due to impingement and bursitis.  Plan for subacromial injection today and referral to PT.  Recheck in about 6 weeks.    PDMP not reviewed this encounter. Orders Placed This Encounter  Procedures   US  LIMITED JOINT SPACE STRUCTURES UP LEFT(NO LINKED CHARGES)    Reason for Exam (SYMPTOM  OR DIAGNOSIS REQUIRED):   left shoulder pain    Preferred imaging location?:   [facility] referral to Physical Therapy    Referral Priority:   Routine  Referral Type:   Physical Medicine    Referral Reason:   Specialty Services Required    Requested Specialty:   Physical Therapy    Number of Visits Requested:   1   No orders of the defined types were placed in this encounter.    Discussed warning signs  or symptoms. Please see discharge instructions. Patient expresses understanding.   The above documentation has been reviewed and is accurate and complete Garlan Juniper, M.D.

## 2023-12-20 ENCOUNTER — Ambulatory Visit: Payer: Medicare Other | Admitting: Family Medicine

## 2023-12-20 ENCOUNTER — Other Ambulatory Visit: Payer: Self-pay

## 2023-12-20 VITALS — BP 146/88 | HR 74 | Ht 65.0 in | Wt 183.0 lb

## 2023-12-20 DIAGNOSIS — G8929 Other chronic pain: Secondary | ICD-10-CM | POA: Diagnosis not present

## 2023-12-20 DIAGNOSIS — M25512 Pain in left shoulder: Secondary | ICD-10-CM | POA: Diagnosis not present

## 2023-12-20 NOTE — Patient Instructions (Addendum)
Thank you for coming in today.   I've referred you to Physical Therapy.  Let us know if you don't hear from them in one week.   You received an injection today. Seek immediate medical attention if the joint becomes red, extremely painful, or is oozing fluid.   Check back in 6 weeks

## 2023-12-28 ENCOUNTER — Ambulatory Visit: Payer: PRIVATE HEALTH INSURANCE | Admitting: Gastroenterology

## 2023-12-28 ENCOUNTER — Ambulatory Visit: Payer: Medicare Other | Admitting: Gastroenterology

## 2023-12-28 NOTE — Progress Notes (Deleted)
 HPI :    Past Medical History:  Diagnosis Date   Hypertension    Thyroid disease      Past Surgical History:  Procedure Laterality Date   HYSTERECTOMY ABDOMINAL WITH SALPINGECTOMY     Family History  Problem Relation Age of Onset   Diabetes Mellitus II Neg Hx    Social History   Tobacco Use   Smoking status: Never   Smokeless tobacco: Never  Vaping Use   Vaping status: Never Used   Current Outpatient Medications  Medication Sig Dispense Refill   aspirin EC 81 MG tablet Take 81 mg by mouth 3 (three) times a week.     b complex vitamins tablet Take 1 tablet by mouth daily with breakfast.     CALCIUM PO Take 1 tablet by mouth daily with breakfast.     cetirizine (ZYRTEC) 10 MG tablet Take 10 mg by mouth daily as needed for allergies or rhinitis (or "seasonal allergies").     Cholecalciferol (VITAMIN D-3 PO) Take 1 capsule by mouth daily with breakfast.     dexamethasone (DECADRON) 6 MG tablet Take 1 tablet (6 mg total) by mouth daily. (Patient not taking: Reported on 12/16/2023) 5 tablet 0   FLUoxetine (PROZAC) 40 MG capsule Take 40 mg by mouth daily as needed (for mood).      fluticasone (FLONASE) 50 MCG/ACT nasal spray Place 1-2 sprays into both nostrils daily as needed for allergies or rhinitis (or "seasonal allergies").     levothyroxine (SYNTHROID) 50 MCG tablet Take 50 mcg by mouth daily before breakfast.     losartan (COZAAR) 50 MG tablet Take 50 mg by mouth daily.     metFORMIN (GLUCOPHAGE) 500 MG tablet TAKE 1 TABLET BY MOUTH EVERY DAY WITH A MEAL for 90     multivitamin-lutein (OCUVITE-LUTEIN) CAPS capsule Take 1 capsule by mouth daily.     pantoprazole (PROTONIX) 40 MG tablet Take 40 mg by mouth daily.     rosuvastatin (CRESTOR) 10 MG tablet Take 1 tablet (10 mg total) by mouth daily. 90 tablet 3   simvastatin (ZOCOR) 10 MG tablet TAKE 1 TABLET BY MOUTH EVERY DAY IN THE EVENING for 90     VITAMIN E PO Take 1 capsule by mouth daily with breakfast.     No current  facility-administered medications for this visit.   No Known Allergies   Review of Systems: All systems reviewed and negative except where noted in HPI.    DG Shoulder Left Result Date: 12/16/2023 CLINICAL DATA:  Left shoulder pain EXAM: LEFT SHOULDER - 2+ VIEW COMPARISON:  None Available. FINDINGS: No fracture or malalignment. Mild AC joint degenerative change. Left apex is clear IMPRESSION: Mild AC joint degenerative change. Electronically Signed   By: Jasmine Pang M.D.   On: 12/16/2023 15:41    Physical Exam: There were no vitals taken for this visit. Constitutional: Pleasant,well-developed, ***female in no acute distress. HEENT: Normocephalic and atraumatic. Conjunctivae are normal. No scleral icterus. Neck supple.  Cardiovascular: Normal rate, regular rhythm.  Pulmonary/chest: Effort normal and breath sounds normal. No wheezing, rales or rhonchi. Abdominal: Soft, nondistended, nontender. Bowel sounds active throughout. There are no masses palpable. No hepatomegaly. Extremities: no edema Lymphadenopathy: No cervical adenopathy noted. Neurological: Alert and oriented to person place and time. Skin: Skin is warm and dry. No rashes noted. Psychiatric: Normal mood and affect. Behavior is normal.  CBC    Component Value Date/Time   WBC 8.8 07/28/2023 1337   RBC 4.94 07/28/2023  1337   HGB 12.9 07/28/2023 1337   HCT 40.9 07/28/2023 1337   PLT 280.0 07/28/2023 1337   MCV 82.9 07/28/2023 1337   MCH 26.7 02/21/2020 0307   MCHC 31.6 07/28/2023 1337   RDW 14.0 07/28/2023 1337   LYMPHSABS 2.5 07/28/2023 1337   MONOABS 0.5 07/28/2023 1337   EOSABS 0.3 07/28/2023 1337   BASOSABS 0.0 07/28/2023 1337    CMP     Component Value Date/Time   NA 139 07/28/2023 1337   K 3.6 07/28/2023 1337   CL 103 07/28/2023 1337   CO2 30 07/28/2023 1337   GLUCOSE 95 07/28/2023 1337   BUN 14 07/28/2023 1337   CREATININE 0.87 07/28/2023 1337   CALCIUM 9.4 07/28/2023 1337   PROT 7.7 07/28/2023  1337   ALBUMIN 4.5 07/28/2023 1337   AST 20 07/28/2023 1337   ALT 17 07/28/2023 1337   ALKPHOS 85 07/28/2023 1337   BILITOT 0.4 07/28/2023 1337   GFRNONAA >60 02/21/2020 0307   GFRAA >60 02/21/2020 0307       Latest Ref Rng & Units 07/28/2023    1:37 PM 02/21/2020    3:07 AM 02/20/2020    3:43 AM  CBC EXTENDED  WBC 4.0 - 10.5 K/uL 8.8  9.8  11.7   RBC 3.87 - 5.11 Mil/uL 4.94  4.15  4.10   Hemoglobin 12.0 - 15.0 g/dL 16.1  09.6  04.5   HCT 36.0 - 46.0 % 40.9  34.9  34.3   Platelets 150.0 - 400.0 K/uL 280.0  363  320   NEUT# 1.4 - 7.7 K/uL 5.4     Lymph# 0.7 - 4.0 K/uL 2.5         ASSESSMENT AND PLAN:  Misty Pruitt, *

## 2023-12-31 NOTE — Therapy (Addendum)
 OUTPATIENT PHYSICAL THERAPY SHOULDER EVALUATION   Patient Name: Misty Pruitt MRN: [id number] DOB:1956/02/23, 68 y.o., female Today's Date: 01/03/2024  END OF SESSION:  PT End of Session - 01/03/24 0800     Visit Number 1    Date for PT Re-Evaluation 03/27/24    Authorization Type [employer]    PT Start Time 0800    PT Stop Time 0845    PT Time Calculation (min) 45 min    Activity Tolerance Patient limited by pain;Patient tolerated treatment well    Behavior During Therapy [facility] for tasks assessed/performed             Past Medical History:  Diagnosis Date   Hypertension    Thyroid disease    Past Surgical History:  Procedure Laterality Date   HYSTERECTOMY ABDOMINAL WITH SALPINGECTOMY     Patient Active Problem List   Diagnosis Date Noted   Chronic left shoulder pain 12/16/2023   Infectious diarrhea 12/16/2023   Chronic pain of both hips 07/28/2023   Chronic constipation 07/28/2023   Tinnitus of right ear 07/28/2023   Essential hypertension 02/16/2020   Hypothyroidism 02/16/2020    PCP: Edwina Barth  REFERRING PROVIDER: Clementeen Graham  REFERRING DIAG:  [phone] (ICD-10-CM) - Chronic left shoulder pain    THERAPY DIAG:  Acute pain of left shoulder  Stiffness of left shoulder, not elsewhere classified  Muscle weakness (generalized)  Impingement of left shoulder  Bursitis of left shoulder  Radiculopathy, cervicothoracic region  Rationale for Evaluation and Treatment: Rehabilitation  ONSET DATE: "2 months"  SUBJECTIVE:                                                                                                                                                                                      SUBJECTIVE STATEMENT: Back in December I laid down in the cold and I felt a pain in my back. A week later my  shoulder started hurting really bad. 2 weeks ago I got an injection. Some days it is still bad, I can't sleep at night. The pain is still there.    Hand dominance: Right  PERTINENT HISTORY: Misty Pruitt is a 68 y.o. female who presents to [facility] today for L shoulder pain ongoing for almost 2 month. Pt locates pain to L side of he neck, scapular region, and all over the L shoulder. In [place] she had what sounds like a PRP injection which helped some few months ago.   PAIN:  Are you having pain? Yes: NPRS scale: 6/10 Pain location: L shoulder, goes into my neck and sometimes into my arm to my elbow Pain description:  when it bothers it is sharp Aggravating factors: if I use it, gets worse at end of day  Relieving factors: some creams help a little bit, if hold something heavy it in my hand it feels better  PRECAUTIONS: None  RED FLAGS: None   WEIGHT BEARING RESTRICTIONS: No  FALLS:  Has patient fallen in last 6 months? No  LIVING ENVIRONMENT: Lives with: lives alone Lives in: House/apartment Stairs: No Has following equipment at home: None  OCCUPATION: Not working  PLOF: Independent  PATIENT GOALS: take care of my plants and have no pain, work on my farm   NEXT MD VISIT:   OBJECTIVE:  Note: Objective measures were completed at Evaluation unless otherwise noted.  DIAGNOSTIC FINDINGS:  CLINICAL DATA:  Left shoulder pain EXAM: LEFT SHOULDER - 2+ VIEW COMPARISON:  None Available. FINDINGS: No fracture or malalignment. Mild AC joint degenerative change. Left apex is clear IMPRESSION: Mild AC joint degenerative change.   Rotator cuff tendons are intact however patient does have a large subacromial subdeltoid bursitis prior to injection.   PATIENT SURVEYS:  Quick Dash 56.8%  COGNITION: Overall cognitive status: Within functional limits for tasks assessed     SENSATION: WFL  POSTURE: Rounded shoulders  UPPER EXTREMITY ROM:   Active ROM Right eval Left Eval AROM (seated) PROM (supine)  Shoulder flexion WFL 75 with pain 120   Shoulder extension    Shoulder  abduction WFL 73 with pain 90  Shoulder adduction    Shoulder internal rotation WFL 35 with pain 58  Shoulder external rotation WFL 25 with pain 55  Elbow flexion    Elbow extension    Wrist flexion    Wrist extension    Wrist ulnar deviation    Wrist radial deviation    Wrist pronation    Wrist supination    (Blank rows = not tested)  UPPER EXTREMITY MMT:  MMT Right eval Left eval  Shoulder flexion  2  Shoulder extension    Shoulder abduction  2  Shoulder adduction    Shoulder internal rotation  2+  Shoulder external rotation  2+  Middle trapezius    Lower trapezius    Elbow flexion    Elbow extension    Wrist flexion    Wrist extension    Wrist ulnar deviation    Wrist radial deviation    Wrist pronation    Wrist supination    Grip strength (lbs)    (Blank rows = not tested)  SHOULDER SPECIAL TESTS: Impingement tests: Neer impingement test: positive , Hawkins/Kennedy impingement test: positive , and Painful arc test: positive  Rotator cuff assessment: Empty can test: positive   JOINT MOBILITY TESTING:  Increase muscle guarding with PROM, firm end feels. Able to get more range with PROM but pain is a big limiting factor  PALPATION:  TTP around c-spine and L shoulder joint, tightness in upper traps  TREATMENT DATE: 01/03/24- EVAL   PATIENT EDUCATION: Education details: POC and HEP, trying to rest shoulder and avoid painful movements and try ice/heat Person educated: Patient Education method: Explanation Education comprehension: verbalized understanding  HOME EXERCISE PROGRAM: Access Code: [email or web address] URL: [email or web address] Date: 01/03/2024 Prepared by: Cassie Freer  Exercises - Standing Shoulder Abduction AAROM with Dowel  - 1 x daily - 7 x weekly - 2 sets - 10 reps - Supine Shoulder Flexion Extension AAROM with  Dowel  - 1 x daily - 7 x weekly - 2 sets - 10 reps - Shoulder Flexion Wall Slide with Towel  - 1 x daily - 7 x weekly - 2 sets - 10 reps - Circular Shoulder Pendulum with Table Support  - 1 x daily - 7 x weekly - 2 sets - 10 reps  ASSESSMENT:  CLINICAL IMPRESSION: Patient is a 68 y.o. female who was seen today for physical therapy evaluation and treatment for pain due to impingement and bursitis. She received subacromial injection on 12/20/23 and states she thinks it helped with some of the inflammation but her pain still remains. Patient is very guarded and has pain with just about all motions with the LUE. She also reports pain in her neck and thoracic spine. She is able to get into further ranges with PROM but muscle guards, thus preventing further ranges. She states that her arm feels better when she holds things in her L arm and with lateral distraction. 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   OBJECTIVE IMPAIRMENTS: decreased ROM, decreased strength, impaired UE functional use, improper body mechanics, and pain.   ACTIVITY LIMITATIONS: carrying, bathing, dressing, and reach over head  PARTICIPATION LIMITATIONS: cleaning, laundry, and yard work  [facility] POTENTIAL: Good  CLINICAL DECISION MAKING: Stable/uncomplicated  EVALUATION COMPLEXITY: Low  GOALS: Goals reviewed with patient? Yes  SHORT TERM GOALS: Target date: 02/14/24  Patient will be independent with initial HEP.  Baseline: given 01/03/24 Goal status: INITIAL  2.  Patient will be able to achieve over 90d of flexion and abduction without pain   Baseline: unable to do Goal status: INITIAL   LONG TERM GOALS: Target date: 03/27/24  Patient will be independent with advanced/ongoing HEP to improve outcomes and carryover.  Baseline:  Goal status: INITIAL  2.  Patient will report 75% improvement in L  shoulder pain to improve QOL.  Baseline: 6/10 today Goal status: INITIAL  3.  Patient to improve L shoulder AROM to [facility] without pain provocation to allow for increased ease of ADLs.  Baseline: see chart Goal status: INITIAL  4.  Patient will demonstrate improved functional UE strength as demonstrated by 4+/5. Baseline: 2/5 Goal status: INITIAL  5.  Patient will report 10 points improvement on QuickDash to demonstrate improved functional ability.  Baseline: 56.8% Goal status: INITIAL  6. 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 difficulty d/t pain  Baseline: L shoulder limits ability   Goal status: INITIAL   PLAN:  PT FREQUENCY: 2x/week  PT DURATION: 12 weeks  PLANNED INTERVENTIONS: 97110-Therapeutic exercises, 97530- Therapeutic activity, [id number]- Neuromuscular re-education, 97535- Self Care, 16109- Manual therapy, G0283- Electrical stimulation (unattended), 97016- Vasopneumatic device, [id number]- Ultrasound, 60454- Ionotophoresis 4mg /ml Dexamethasone, Patient/Family education, Taping, Joint mobilization, Joint manipulation, Spinal manipulation, Spinal mobilization, Cryotherapy, and Moist heat  PLAN FOR NEXT SESSION:  Modalities as needed to decrease pain, work on flexibility and range of motion.    [place], PT 01/03/2024, 8:57 AM

## 2024-01-03 ENCOUNTER — Ambulatory Visit: Payer: Medicare Other | Attending: Family Medicine

## 2024-01-03 DIAGNOSIS — M25612 Stiffness of left shoulder, not elsewhere classified: Secondary | ICD-10-CM | POA: Diagnosis not present

## 2024-01-03 DIAGNOSIS — M25812 Other specified joint disorders, left shoulder: Secondary | ICD-10-CM | POA: Insufficient documentation

## 2024-01-03 DIAGNOSIS — G8929 Other chronic pain: Secondary | ICD-10-CM | POA: Diagnosis not present

## 2024-01-03 DIAGNOSIS — M25512 Pain in left shoulder: Secondary | ICD-10-CM | POA: Diagnosis not present

## 2024-01-03 DIAGNOSIS — M5413 Radiculopathy, cervicothoracic region: Secondary | ICD-10-CM | POA: Diagnosis not present

## 2024-01-03 DIAGNOSIS — M7552 Bursitis of left shoulder: Secondary | ICD-10-CM | POA: Insufficient documentation

## 2024-01-03 DIAGNOSIS — M6281 Muscle weakness (generalized): Secondary | ICD-10-CM | POA: Insufficient documentation

## 2024-01-05 ENCOUNTER — Ambulatory Visit: Payer: Medicare Other | Admitting: Internal Medicine

## 2024-01-05 ENCOUNTER — Encounter: Payer: Self-pay | Admitting: Internal Medicine

## 2024-01-05 VITALS — BP 124/70 | HR 77 | Ht 65.0 in | Wt 181.0 lb

## 2024-01-05 DIAGNOSIS — K5900 Constipation, unspecified: Secondary | ICD-10-CM

## 2024-01-05 DIAGNOSIS — Z1211 Encounter for screening for malignant neoplasm of colon: Secondary | ICD-10-CM

## 2024-01-05 MED ORDER — SUFLAVE 178.7 G PO SOLR: 1.0000 | Freq: Once | ORAL | 0 refills | Status: DC

## 2024-01-05 MED ORDER — POLYETHYLENE GLYCOL 3350 17 GM/SCOOP PO POWD: 1.0000 | Freq: Every day | ORAL | 1 refills | Status: AC

## 2024-01-05 MED ORDER — SUFLAVE 178.7 G PO SOLR: 1.0000 | Freq: Once | ORAL | 0 refills | Status: AC

## 2024-01-05 NOTE — Progress Notes (Unsigned)
 Chief Complaint: Constipation  HPI : 68 year old female with history of hypothyroidism and HTN presents with constipation  She has had constipation for the last 6 months. She has 3 BMs per day on average. She has been more stressed, which may be contributing to her symptoms. She has to strain when she has a BM even if she has increased physical activity and eating more papaya (while she was in [place] recently).  She was prescribed an antispasmodic medication while she was in [place].  In the [place] she has been eating apples and bananas. She not tried any laxatives. Denies hematochezia or melena. She has lost about 2 lbs over a few weeks. Endorses ab pain that comes-and-goes that mostly in the lower abdomen on the right side. Denies lightheadedness. Endorse nausea that she attributes to gas. Denies vomiting. Denies dysphagia. Has occasional acid reflux with omeprazole and baking soda. She had a colonoscopy 25 years ago that was normal. She has cut down on lactose intake. Denies family history of GI issues.   Wt Readings from Last 3 Encounters:  01/05/24 181 lb (82.1 kg)  12/20/23 183 lb (83 kg)  12/16/23 183 lb (83 kg)   Past Medical History:  Diagnosis Date   Hypertension    Thyroid disease    Past Surgical History:  Procedure Laterality Date   HYSTERECTOMY ABDOMINAL WITH SALPINGECTOMY     Family History  Problem Relation Age of Onset   Diabetes Mother    Hypertension Mother    Diabetes Mellitus II Neg Hx    Colon cancer Neg Hx    Stomach cancer Neg Hx    Esophageal cancer Neg Hx    Social History   Tobacco Use   Smoking status: Never   Smokeless tobacco: Never  Vaping Use   Vaping status: Never Used  Substance Use Topics   Alcohol use: Never   Current Outpatient Medications  Medication Sig Dispense Refill   aspirin EC 81 MG tablet Take 81 mg by mouth 3 (three) times a week.     b complex vitamins tablet Take 1 tablet by mouth daily with breakfast.     CALCIUM PO Take 1  tablet by mouth daily with breakfast.     cetirizine (ZYRTEC) 10 MG tablet Take 10 mg by mouth daily as needed for allergies or rhinitis (or "seasonal allergies").     Cholecalciferol (VITAMIN D-3 PO) Take 1 capsule by mouth daily with breakfast.     dexamethasone (DECADRON) 6 MG tablet Take 1 tablet (6 mg total) by mouth daily. 5 tablet 0   FLUoxetine (PROZAC) 40 MG capsule Take 40 mg by mouth daily as needed (for mood).      fluticasone (FLONASE) 50 MCG/ACT nasal spray Place 1-2 sprays into both nostrils daily as needed for allergies or rhinitis (or "seasonal allergies").     levothyroxine (SYNTHROID) 50 MCG tablet Take 50 mcg by mouth daily before breakfast.     losartan (COZAAR) 50 MG tablet Take 50 mg by mouth daily.     metFORMIN (GLUCOPHAGE) 500 MG tablet TAKE 1 TABLET BY MOUTH EVERY DAY WITH A MEAL for 90     multivitamin-lutein (OCUVITE-LUTEIN) CAPS capsule Take 1 capsule by mouth daily.     pantoprazole (PROTONIX) 40 MG tablet Take 40 mg by mouth daily.     simvastatin (ZOCOR) 10 MG tablet TAKE 1 TABLET BY MOUTH EVERY DAY IN THE EVENING for 90     VITAMIN E PO Take 1 capsule by  mouth daily with breakfast.     rosuvastatin (CRESTOR) 10 MG tablet Take 1 tablet (10 mg total) by mouth daily. (Patient not taking: Reported on 01/05/2024) 90 tablet 3   No current facility-administered medications for this visit.   No Known Allergies   Review of Systems: All systems reviewed and negative except where noted in HPI.   Physical Exam: BP 124/70   Pulse 77   Ht 5\' 5"  (1.651 m)   Wt 181 lb (82.1 kg)   BMI 30.12 kg/m  Constitutional: Pleasant,well-developed, female in no acute distress. HEENT: Normocephalic and atraumatic. Conjunctivae are normal. No scleral icterus. Cardiovascular: Normal rate Pulmonary/chest: Effort normal Abdominal: Soft, nondistended, scattered areas of tenderness throughout he abdomen.  Extremities: No edema Neurological: Alert and oriented to person place and  time. Skin: Skin is warm and dry. No rashes noted. Psychiatric: Normal mood and affect. Behavior is normal.  Labs 07/2023: CBC nml. CMP nml. TSH nml.   ASSESSMENT AND PLAN: Constipation Colon cancer screening Patient presents with worsening constipation for the last 6 months.  Has to strain when passing a BM.  She has not tried any laxatives yet.  I went over conservative strategies to try and help with her constipation and started her on daily Miralax.  Since her last colonoscopy was 25 years ago, we will also get her scheduled for colonoscopy for colon cancer screening. I went over the risks and benefits of the this procedure, and she is agreeable to proceeding. - Patient will get her TSH checked with her PCP soon - Drink 8 cups of water, walk 30 min per day, and daily fiber supplement - Start daily Miralax. Can uptitrate if needed - Colonoscopy [facility] with 2-day prep  Eulah Pont, MD  I spent 45 minutes of time, including in depth chart review, independent review of results as outlined above, communicating results with the patient directly, face-to-face time with the patient, coordinating care, ordering studies and medications as appropriate, and documentation.

## 2024-01-05 NOTE — Patient Instructions (Addendum)
 You have been scheduled for a colonoscopy. Please follow written instructions given to you at your visit today.   If you use inhalers (even only as needed), please bring them with you on the day of your procedure.  DO NOT TAKE 7 DAYS PRIOR TO TEST- Trulicity (dulaglutide) Ozempic, Wegovy (semaglutide) Mounjaro (tirzepatide) Bydureon Bcise (exanatide extended release)  DO NOT TAKE 1 DAY PRIOR TO YOUR TEST Rybelsus (semaglutide) Adlyxin (lixisenatide) Victoza (liraglutide) Byetta (exanatide) ___________________________________________________________________________  Bonita Quin will receive your bowel preparation through [facility], which ensures the lowest copay and home delivery, with outreach via text or call from an [phone] number. Please respond promptly to avoid rescheduling of your procedure. If you are interested in alternative options or have any questions regarding your prep, please contact them at [phone] ____________________________________________________________________________  Your Provider Has Sent Your Bowel Prep Regimen To [facility]   [facility] will contact you to verify your information and collect your copay, if applicable. Enjoy the comfort of your home while your prescription is mailed to you, FREE of any shipping charges.   [facility] accepts all major insurance benefits and applies discounts & coupons.  Have additional questions?   Chat: [email or web address] Call: [phone] Email: [email or web address] NCPDP: [id number]  How will [facility] contact you?  With a Welcome phone call,  a Welcome text and a checkout link in text form.  Texts you receive from [phone] Are NOT Spam.  *To set up delivery, you must complete the checkout process via link or speak to one of the patient care representatives. If [facility] is unable to reach you, your prescription may be delayed.  To avoid long hold times on the phone, you may also utilize the secure chat  feature on the [facility] website to request that they call you back for transaction completion or to expedite your concerns.  Drink 8 cups of water a day and walk 30 minutes a day. Please purchase the following medications over the counter and take as directed: Fiber supplement such as Benefiber- use as directed daily Miralax: Take as  directed up to 3 times a day to achieve regular bowel movements  _______________________________________________________  If your blood pressure at your visit was 140/90 or greater, please contact your primary care physician to follow up on this.  _______________________________________________________  If you are age 13 or older, your body mass index should be between 23-30. Your Body mass index is 30.12 kg/m. If this is out of the aforementioned range listed, please consider follow up with your Primary Care Provider.  If you are age 20 or younger, your body mass index should be between 19-25. Your Body mass index is 30.12 kg/m. If this is out of the aformentioned range listed, please consider follow up with your Primary Care Provider.   ________________________________________________________  The [facility] providers would like to encourage you to use [facility] to communicate with providers for non-urgent requests or questions.  Due to long hold times on the telephone, sending your provider a message by [facility] may be a faster and more efficient way to get a response.  Please allow 48 business hours for a response.  Please remember that this is for non-urgent requests.  _______________________________________________________ Due to recent changes in healthcare laws, you may see the results of your imaging and laboratory studies on MyChart before your provider has had a chance to review them.  We understand that in some cases there may be results that are confusing or concerning to you. Not all laboratory results come back in  the same time frame and the provider  may be waiting for multiple results in order to interpret others.  Please give [place] 48 hours in order for your provider to thoroughly review all the results before contacting the office for clarification of your results.   Thank you for entrusting me with your care and for choosing [facility], Dr. Eulah Pont

## 2024-01-06 ENCOUNTER — Other Ambulatory Visit: Payer: Self-pay | Admitting: Emergency Medicine

## 2024-01-06 ENCOUNTER — Other Ambulatory Visit (INDEPENDENT_AMBULATORY_CARE_PROVIDER_SITE_OTHER): Payer: Medicare Other

## 2024-01-06 DIAGNOSIS — E039 Hypothyroidism, unspecified: Secondary | ICD-10-CM

## 2024-01-06 DIAGNOSIS — A09 Infectious gastroenteritis and colitis, unspecified: Secondary | ICD-10-CM | POA: Diagnosis not present

## 2024-01-06 LAB — HEMOGLOBIN A1C: Hgb A1c MFr Bld: 5.9 % (ref 4.6–6.5)

## 2024-01-06 LAB — CBC WITH DIFFERENTIAL/PLATELET
Basophils Absolute: 0.1 10*3/uL (ref 0.0–0.1)
Basophils Relative: 0.7 % (ref 0.0–3.0)
Eosinophils Absolute: 0.2 10*3/uL (ref 0.0–0.7)
Eosinophils Relative: 2.7 % (ref 0.0–5.0)
HCT: 39.2 % (ref 36.0–46.0)
Hemoglobin: 12.9 g/dL (ref 12.0–15.0)
Lymphocytes Relative: 27.1 % (ref 12.0–46.0)
Lymphs Abs: 2.1 10*3/uL (ref 0.7–4.0)
MCHC: 32.9 g/dL (ref 30.0–36.0)
MCV: 82.6 fl (ref 78.0–100.0)
Monocytes Absolute: 0.5 10*3/uL (ref 0.1–1.0)
Monocytes Relative: 6.7 % (ref 3.0–12.0)
Neutro Abs: 4.9 10*3/uL (ref 1.4–7.7)
Neutrophils Relative %: 62.8 % (ref 43.0–77.0)
Platelets: 282 10*3/uL (ref 150.0–400.0)
RBC: 4.74 Mil/uL (ref 3.87–5.11)
RDW: 14.9 % (ref 11.5–15.5)
WBC: 7.8 10*3/uL (ref 4.0–10.5)

## 2024-01-06 LAB — LIPID PANEL
Cholesterol: 179 mg/dL (ref 0–200)
HDL: 49.3 mg/dL (ref >39.00)
LDL Cholesterol: 112 mg/dL — ABNORMAL HIGH (ref 0–99)
NonHDL: 129.67
Total CHOL/HDL Ratio: 4
Triglycerides: 90 mg/dL (ref 0.0–149.0)
VLDL: 18 mg/dL (ref 0.0–40.0)

## 2024-01-06 LAB — TSH: TSH: 4.01 u[IU]/mL (ref 0.35–5.50)

## 2024-01-06 LAB — COMPREHENSIVE METABOLIC PANEL
ALT: 13 U/L (ref 0–35)
AST: 16 U/L (ref 0–37)
Albumin: 4 g/dL (ref 3.5–5.2)
Alkaline Phosphatase: 58 U/L (ref 39–117)
BUN: 10 mg/dL (ref 6–23)
CO2: 28 meq/L (ref 19–32)
Calcium: 9 mg/dL (ref 8.4–10.5)
Chloride: 107 meq/L (ref 96–112)
Creatinine, Ser: 0.72 mg/dL (ref 0.40–1.20)
GFR: 87.16 mL/min (ref >60.00)
Glucose, Bld: 98 mg/dL (ref 70–99)
Potassium: 3.9 meq/L (ref 3.5–5.1)
Sodium: 141 meq/L (ref 135–145)
Total Bilirubin: 0.5 mg/dL (ref 0.2–1.2)
Total Protein: 7.1 g/dL (ref 6.0–8.3)

## 2024-01-10 ENCOUNTER — Ambulatory Visit: Payer: Medicare Other | Attending: Family Medicine | Admitting: Physical Therapy

## 2024-01-17 ENCOUNTER — Ambulatory Visit: Payer: Medicare Other

## 2024-01-20 ENCOUNTER — Ambulatory Visit: Payer: Medicare Other | Admitting: Physical Therapy

## 2024-01-24 ENCOUNTER — Ambulatory Visit: Payer: Medicare Other

## 2024-01-31 ENCOUNTER — Ambulatory Visit: Payer: Medicare Other

## 2024-02-03 ENCOUNTER — Encounter: Payer: Medicare Other | Admitting: Internal Medicine

## 2024-02-23 ENCOUNTER — Encounter: Payer: Self-pay | Admitting: Emergency Medicine

## 2024-02-23 ENCOUNTER — Ambulatory Visit (INDEPENDENT_AMBULATORY_CARE_PROVIDER_SITE_OTHER): Admitting: Emergency Medicine

## 2024-02-23 VITALS — BP 130/78 | HR 75 | Temp 97.9°F | Resp 18 | Ht 65.0 in | Wt 180.6 lb

## 2024-02-23 DIAGNOSIS — E039 Hypothyroidism, unspecified: Secondary | ICD-10-CM | POA: Diagnosis not present

## 2024-02-23 DIAGNOSIS — T07XXXA Unspecified multiple injuries, initial encounter: Secondary | ICD-10-CM | POA: Diagnosis not present

## 2024-02-23 DIAGNOSIS — E785 Hyperlipidemia, unspecified: Secondary | ICD-10-CM | POA: Insufficient documentation

## 2024-02-23 DIAGNOSIS — H9201 Otalgia, right ear: Secondary | ICD-10-CM | POA: Insufficient documentation

## 2024-02-23 DIAGNOSIS — I10 Essential (primary) hypertension: Secondary | ICD-10-CM

## 2024-02-23 DIAGNOSIS — H9311 Tinnitus, right ear: Secondary | ICD-10-CM

## 2024-02-23 DIAGNOSIS — Z789 Other specified health status: Secondary | ICD-10-CM | POA: Diagnosis not present

## 2024-02-23 DIAGNOSIS — R32 Unspecified urinary incontinence: Secondary | ICD-10-CM | POA: Insufficient documentation

## 2024-02-23 MED ORDER — OMEGA-3-ACID ETHYL ESTERS 1 G PO CAPS: 1.0000 g | ORAL_CAPSULE | Freq: Two times a day (BID) | ORAL | 3 refills | Status: AC

## 2024-02-23 NOTE — Progress Notes (Signed)
 Misty Pruitt 68 y.o.   Chief Complaint  Patient presents with   Ear Pain    Past 3 days    HISTORY OF PRESENT ILLNESS: This is a 68 y.o. female here with various concerns: 1.  Right ear pain that started after sustaining a fall 2 weeks ago 2.  Tinnitus same ear 3.  Trip and fall 2 weeks ago with multiple contusions and abrasions.  Recovering well 4.  Statin intolerance.  History of high cholesterol.  Requesting prescription for omega-3 acid 5.  Urinary incontinence No other complaints or medical concerns today.  HPI   Prior to Admission medications   Medication Sig Start Date End Date Taking? Authorizing Provider  aspirin EC 81 MG tablet Take 81 mg by mouth 3 (three) times a week.    [provider]  b complex vitamins tablet Take 1 tablet by mouth daily with breakfast.    [provider]  CALCIUM PO Take 1 tablet by mouth daily with breakfast.    [provider]  cetirizine (ZYRTEC) 10 MG tablet Take 10 mg by mouth daily as needed for allergies or rhinitis (or "seasonal allergies").    [provider]  Cholecalciferol (VITAMIN D-3 PO) Take 1 capsule by mouth daily with breakfast.    [provider]  dexamethasone (DECADRON) 6 MG tablet Take 1 tablet (6 mg total) by mouth daily. 02/21/20   Ghimire, Werner Lean, MD  FLUoxetine (PROZAC) 40 MG capsule Take 40 mg by mouth daily as needed (for mood).  12/12/19   [provider]  fluticasone (FLONASE) 50 MCG/ACT nasal spray Place 1-2 sprays into both nostrils daily as needed for allergies or rhinitis (or "seasonal allergies").    [provider]  levothyroxine (SYNTHROID) 50 MCG tablet Take 50 mcg by mouth daily before breakfast.    [provider]  losartan (COZAAR) 50 MG tablet Take 50 mg by mouth daily.    [provider]  metFORMIN (GLUCOPHAGE) 500 MG tablet TAKE 1 TABLET BY MOUTH EVERY DAY WITH A MEAL for 90    [provider]  multivitamin-lutein  (OCUVITE-LUTEIN) CAPS capsule Take 1 capsule by mouth daily.    [provider]  pantoprazole (PROTONIX) 40 MG tablet Take 40 mg by mouth daily. 12/17/23   [provider]  polyethylene glycol powder (GLYCOLAX/MIRALAX) 17 GM/SCOOP powder Take 255 g by mouth daily. 01/05/24   Imogene Burn, MD  rosuvastatin (CRESTOR) 10 MG tablet Take 1 tablet (10 mg total) by mouth daily. Patient not taking: Reported on 01/05/2024 07/28/23   Georgina Quint, MD  simvastatin (ZOCOR) 10 MG tablet TAKE 1 TABLET BY MOUTH EVERY DAY IN THE EVENING for 90    [provider]  VITAMIN E PO Take 1 capsule by mouth daily with breakfast.    [provider]    No Known Allergies  Patient Active Problem List   Diagnosis Date Noted   Chronic left shoulder pain 12/16/2023   Infectious diarrhea 12/16/2023   Chronic pain of both hips 07/28/2023   Chronic constipation 07/28/2023   Tinnitus of right ear 07/28/2023   Essential hypertension 02/16/2020   Hypothyroidism 02/16/2020    Past Medical History:  Diagnosis Date   Hypertension    Thyroid disease     Past Surgical History:  Procedure Laterality Date   HYSTERECTOMY ABDOMINAL WITH SALPINGECTOMY      Social History   Socioeconomic History   Marital status: Single    Spouse name: Not  on file   Number of children: 2   Years of education: Not on file   Highest education level: Not on file  Occupational History   Occupation: retired  Tobacco Use   Smoking status: Never   Smokeless tobacco: Never  Vaping Use   Vaping status: Never Used  Substance and Sexual Activity   Alcohol use: Never   Drug use: Not on file   Sexual activity: Not on file  Other Topics Concern   Not on file  Social History Narrative   Not on file   Social Drivers of Health   Financial Resource Strain: Not on file  Food Insecurity: Not on file  Transportation Needs: Not on file  Physical Activity: Not on file  Stress: Not on file  Social  Connections: Not on file  Intimate Partner Violence: Not on file    Family History  Problem Relation Age of Onset   Diabetes Mother    Hypertension Mother    Diabetes Mellitus II Neg Hx    Colon cancer Neg Hx    Stomach cancer Neg Hx    Esophageal cancer Neg Hx      Review of Systems  Constitutional: Negative.  Negative for chills and fever.  HENT:  Positive for ear pain and tinnitus.   Respiratory: Negative.  Negative for cough and shortness of breath.   Cardiovascular: Negative.  Negative for chest pain and palpitations.  Gastrointestinal:  Negative for abdominal pain, diarrhea, nausea and vomiting.  Skin: Negative.  Negative for rash.  Neurological: Negative.  Negative for dizziness and headaches.  All other systems reviewed and are negative.   Today's Vitals   02/23/24 1317  BP: 130/78  Pulse: 75  Resp: 18  Temp: 97.9 F (36.6 C)  TempSrc: Oral  SpO2: 99%  Weight: 180 lb 9.6 oz (81.9 kg)  Height: 5\' 5"  (1.651 m)   Body mass index is 30.05 kg/m.   Physical Exam Vitals reviewed.  Constitutional:      Appearance: Normal appearance.  HENT:     Head: Normocephalic.     Right Ear: Tympanic membrane, ear canal and external ear normal.     Left Ear: Tympanic membrane, ear canal and external ear normal.     Mouth/Throat:     Mouth: Mucous membranes are moist.     Pharynx: Oropharynx is clear.  Eyes:     Extraocular Movements: Extraocular movements intact.     Conjunctiva/sclera: Conjunctivae normal.     Pupils: Pupils are equal, round, and reactive to light.  Cardiovascular:     Rate and Rhythm: Normal rate and regular rhythm.     Pulses: Normal pulses.     Heart sounds: Normal heart sounds.  Pulmonary:     Effort: Pulmonary effort is normal.     Breath sounds: Normal breath sounds.  Musculoskeletal:     Cervical back: No tenderness.     Comments: Left big toe: Positive swelling, erythema and tenderness.  Possible fracture.  Lymphadenopathy:      Cervical: No cervical adenopathy.  Skin:    General: Skin is warm and dry.     Capillary Refill: Capillary refill takes less than 2 seconds.     Comments: Multiple well-healing abrasions.  No signs of infection.  No concerns.  Neurological:     General: No focal deficit present.     Mental Status: She is alert and oriented to person, place, and time.  Psychiatric:        Mood and  Affect: Mood normal.        Behavior: Behavior normal.      ASSESSMENT & PLAN: A total of 42 minutes was spent with the patient and counseling/coordination of care regarding preparing for this visit, review of most recent office visit notes, review of multiple chronic medical conditions and their management, review of all medications, review of most recent bloodwork results, review of health maintenance items, education on nutrition, prognosis, documentation, and need for follow up.   Problem List Items Addressed This Visit       Cardiovascular and Mediastinum   Essential hypertension   BP Readings from Last 3 Encounters:  02/23/24 130/78  01/05/24 124/70  12/20/23 (!) 146/88  Continue losartan 50 mg daily Well-controlled hypertension       Relevant Medications   omega-3 acid ethyl esters (LOVAZA) 1 g capsule     Endocrine   Hypothyroidism   Clinically euthyroid TSH done today Continue levothyroxine 50 mcg daily        Other   Tinnitus of right ear - Primary   Normal examination. Starting to affect hearing and affecting quality of life Recommend ENT evaluation Referral placed today      Relevant Orders   Ambulatory referral to ENT   Dyslipidemia   Intolerant to statins.  Statin myopathy Requesting prescription for omega threes Omega-3 acid ethyl esters 1 g twice a day recommended New prescription for Lovaza sent to pharmacy of record today.      Relevant Medications   omega-3 acid ethyl esters (LOVAZA) 1 g capsule   Statin intolerance   Urinary incontinence   Slowly getting  worse and affecting quality of life. History of hysterectomy in the past Recommend urology evaluation Referral placed today.      Relevant Orders   Ambulatory referral to Urology   Multiple abrasions   Sustained during trip and fall 2 weeks ago Healing well.  No signs of infection. May have fractured left big toe during the fall.  Does not want to do x-ray.      Right ear pain   Normal examination Has had some viral symptoms for the past couple of weeks.  May be related to this.  No findings of otitis media.  No findings of otitis externa Related to tinnitus.  Recommend ENT evaluation. Referral placed today       Relevant Orders   Ambulatory referral to ENT   Patient Instructions  Tinnitus Tinnitus El tinnitus es cuando una persona oye un sonido que no proviene de Dealer. Puede sonar como un zumbido en los odos u otra cosa. El sonido puede ser fuerte, suave o de intensidad intermedia. Puede durar unos pocos segundos o ser [employer]. Puede irse y volver. Casi todas las Environmental health practitioner tinnitus en algn momento. Esto no es lo mismo que la prdida [place]. Pero es posible que deba consultar a un mdico si: Environmental consultant. Vuelve a aparecer con frecuencia. Tiene dificultad para dormir y concentrarse. Cules son las causas? A menudo se desconoce la causa del tinnitus. En algunos casos, puede desarrollarlo si: Est cerca de ruidos fuertes, como de mquinas o msica. Se le atasca un objeto en el odo. Se le acumula cera en el odo. Bebe mucho alcohol o cafena. Toma ciertos medicamentos. Comienza a perder la audicin. Tambin puede desarrollarlo debido a algunas afecciones mdicas. Estas pueden incluir: Infecciones de los odos o de los senos paranasales. Enfermedades cardacas. Presin arterial alta. Alergias. Enfermedad de [place]. Problemas con  la tiroides. Un tumor. Este es un crecimiento de clulas que no es normal. Un vaso sanguneo  dbil o abultado que se llama aneurisma cerca del odo. Qu incrementa el riesgo? Es ms probable que desarrolle tinnitus si: Est mucho tiempo cerca de ruidos fuertes. Es Neomia Dear persona de edad avanzada. Bebe alcohol. Fuma. Cules son los signos o sntomas? El principal sntoma es or un sonido que no proviene de Dealer. Puede sonar como un zumbido. Tambin puede sonar como lo siguiente: Vibracin. Chisporroteo. Soplido de aire. Sibilancia. Silbido. Otros sonidos pueden ser: Crepitacin. Una corriente de agua. Una nota musical. Risa Grill. Murmullo. Puede tener sntomas en uno o ambos odos. Cmo se diagnostica? El tinnitus se diagnostica en funcin de los sntomas, antecedentes mdicos y un examen. El mdico puede hacerle una prueba de audicin completa si el tinnitus: Est en un solo odo. No lo deja or bien. Dura ms de 6 meses. Tambin es posible que deba consultar a un experto en trastornos auditivos llamado audilogo. Este le puede hacer preguntas sobre sus sntomas y cmo afecta el tinnitus su vida diaria. Posiblemente deba realizarse otras pruebas. Estas pueden incluir: Una exploracin por tomografa computarizada (TC). Una resonancia magntica (RM). Un angiograma. Este muestra cmo fluye la sangre a travs de un vaso sanguneo. Cmo se trata? El tratamiento puede incluir: Terapia para ayudarlo a controlar el estrs que significa vivir con tinnitus. Encontrar formas de Investment banker, operational o cubrir el sonido del tinnitus. Esto incluye lo siguiente: Mquinas de sonido o ruido blanco. Dispositivos que se colocan en odo y reproducen sonidos o msica. Un humidificador que haga ruido fuerte. Estimulacin Designer, industrial/product. En esta se usan auriculares para escuchar msica que tiene Chief of Staff. Con el tiempo, esta seal puede cambiar algunas de las vas del cerebro. Esto puede hacer que se vuelva menos sensible al tinnitus. Este tratamiento se [place] en casos muy graves. El  uso de audfonos o implantes cocleares, si el tinnitus se debe a la prdida de la audicin. Si la causa del tinnitus es una afeccin mdica, tratar la afeccin puede hacer que desaparezca.  Siga estas instrucciones en su casa: Controlar los sntomas     Trate de evitar estar en lugares ruidosos o cerca de ruidos fuertes. Use tapones para los odos o auriculares cuando est cerca de ruidos fuertes. Busque maneras de Museum/gallery exhibitions officer. Estas pueden incluir meditacin, yoga o respiracin profunda. Duerma con la cabeza levemente elevada. Instrucciones generales Use los medicamentos de venta libre y los recetados solo como se lo haya indicado el mdico. Sports administrator un seguimiento de las cosas que le causan sntomas (desencadenantes). Trate de evitar esos factores. Para evitar que el tinnitus empeore: No beba alcohol. No consuma cafena. No consuma ningn producto que contenga nicotina o tabaco. Estos productos incluyen cigarrillos, tabaco para Theatre manager y aparatos de vapeo, como los Administrator, Civil Service. Si necesita ayuda para dejar de fumar, consulte al mdico. Evite consumir mucha sal. Duerme lo suficiente cada noche. Dnde obtener ms informacin American Tinnitus Association (Asociacin Estadounidense del Tinnitus): https://www.johnson-hamilton.org/ Comunquese con un mdico si: Los sntomas duran 3 semanas o ms sin detenerse. Tiene prdida repentina de la audicin. Los sntomas empeoran o no mejoran con los Medical laboratory scientific officer. No puede controlar el estrs que significa vivir con tinnitus. Solicite ayuda de inmediato si: Comienza a tener tinnitus despus de sufrir una lesin en la cabeza. Tiene tinnitus y: [employer]. Nuseas y vmitos. Prdida del equilibrio. Dolor de cabeza repentino e intenso. Cambios en la vista. Debilidad en  la cara, los brazos o las piernas. Estos sntomas pueden Customer service manager. Solicite ayuda de inmediato. Llame al 911. No espere a ver si los sntomas desaparecen. No conduzca por  sus propios medios [employer]. Esta informacin no tiene Theme park manager el consejo del mdico. Asegrese de hacerle al mdico cualquier pregunta que tenga. Document Revised: 03/11/2023 Document Reviewed: 03/11/2023 Elsevier Patient Education  2024 Elsevier Inc.    Maryagnes Small, MD [facility]

## 2024-02-23 NOTE — Assessment & Plan Note (Signed)
 Slowly getting worse and affecting quality of life. History of hysterectomy in the past Recommend urology evaluation Referral placed today.

## 2024-02-23 NOTE — Assessment & Plan Note (Signed)
 Normal examination Has had some viral symptoms for the past couple of weeks.  May be related to this.  No findings of otitis media.  No findings of otitis externa Related to tinnitus.  Recommend ENT evaluation. Referral placed today

## 2024-02-23 NOTE — Patient Instructions (Signed)
 Tinnitus Tinnitus El tinnitus es cuando una persona oye un sonido que no proviene de Dealer. Puede sonar como un zumbido en los odos u otra cosa. El sonido puede ser fuerte, [place] o de intensidad intermedia. Puede durar unos pocos segundos o ser [employer]. Puede irse y volver. Casi todas las Environmental health practitioner tinnitus en algn momento. Esto no es lo mismo que la prdida [place]. Pero es posible que deba consultar a un mdico si: Environmental consultant. Vuelve a aparecer con frecuencia. Tiene dificultad para dormir y concentrarse. Cules son las causas? A menudo se desconoce la causa del tinnitus. En algunos casos, puede desarrollarlo si: Est cerca de ruidos fuertes, como de mquinas o [address]. Se le atasca un objeto en el odo. Se le acumula cera en el odo. Bebe mucho alcohol o cafena. Toma ciertos medicamentos. Comienza a perder la audicin. Tambin puede desarrollarlo debido a algunas afecciones mdicas. Estas pueden incluir: Infecciones de los odos o de los senos paranasales. Enfermedades cardacas. Presin arterial alta. Alergias. Enfermedad de Road Runner. Problemas con la tiroides. Un tumor. Este es un crecimiento de clulas que no es normal. Un vaso sanguneo dbil o abultado que se llama aneurisma cerca del odo. Qu incrementa el riesgo? Es ms probable que desarrolle tinnitus si: Est mucho tiempo cerca de ruidos fuertes. Es Neomia Dear persona de edad avanzada. Bebe alcohol. Fuma. Cules son los signos o sntomas? El principal sntoma es or un sonido que no proviene de Dealer. Puede sonar como un zumbido. Tambin puede sonar como lo siguiente: Vibracin. Chisporroteo. Soplido de aire. Sibilancia. Silbido. Otros sonidos pueden ser: Crepitacin. Una corriente de agua. Una nota musical. Risa Grill. [place]. Puede tener sntomas en uno o ambos odos. Cmo se diagnostica? El tinnitus se diagnostica en funcin de los sntomas, antecedentes  mdicos y un examen. El mdico puede hacerle una prueba de audicin completa si el tinnitus: Est en un solo odo. No lo deja or bien. Dura ms de 6 meses. Tambin es posible que deba consultar a un experto en trastornos auditivos llamado audilogo. Este le puede hacer preguntas sobre sus sntomas y cmo afecta el tinnitus su vida diaria. Posiblemente deba realizarse otras pruebas. Estas pueden incluir: Una exploracin por tomografa computarizada (TC). Una resonancia magntica (RM). Un angiograma. Este muestra cmo fluye la sangre a travs de un vaso sanguneo. Cmo se trata? El tratamiento puede incluir: Terapia para ayudarlo a controlar el estrs que significa vivir con tinnitus. Encontrar formas de Investment banker, operational o cubrir el sonido del tinnitus. Esto incluye lo siguiente: Mquinas de sonido o ruido blanco. Dispositivos que se colocan en odo y reproducen sonidos o msica. Un humidificador que haga ruido fuerte. Estimulacin Designer, industrial/product. En esta se usan auriculares para escuchar msica que tiene Chief of Staff. Con el tiempo, esta seal puede cambiar algunas de las vas del cerebro. Esto puede hacer que se vuelva menos sensible al tinnitus. Este tratamiento se [place] en casos muy graves. El uso de audfonos o implantes cocleares, si el tinnitus se debe a la prdida de la audicin. Si la causa del tinnitus es una afeccin mdica, tratar la afeccin puede hacer que desaparezca.  Siga estas instrucciones en su casa: Controlar los sntomas     Trate de evitar estar en lugares ruidosos o cerca de ruidos fuertes. Use tapones para los odos o auriculares cuando est cerca de ruidos fuertes. Busque maneras de Museum/gallery exhibitions officer. Estas pueden incluir meditacin, yoga o respiracin profunda. Duerma con la cabeza levemente elevada. Instrucciones generales  Use los medicamentos de venta libre y los recetados solo como se lo haya indicado el mdico. Sports administrator un seguimiento de las cosas que le  causan sntomas (desencadenantes). Trate de evitar esos factores. Para evitar que el tinnitus empeore: No beba alcohol. No consuma cafena. No consuma ningn producto que contenga nicotina o tabaco. Estos productos incluyen cigarrillos, tabaco para Theatre manager y aparatos de vapeo, como los cigarrillos electrnicos. Si necesita ayuda para dejar de fumar, consulte al mdico. Evite consumir mucha sal. Duerme lo suficiente cada noche. Dnde obtener ms informacin American Tinnitus Association (Asociacin Estadounidense del Tinnitus): https://www.johnson-hamilton.org/ Comunquese con un mdico si: Los sntomas duran 3 semanas o ms sin detenerse. Tiene prdida repentina de la audicin. Los sntomas empeoran o no mejoran con los Medical laboratory scientific officer. No puede controlar el estrs que significa vivir con tinnitus. Solicite ayuda de inmediato si: Comienza a tener tinnitus despus de sufrir una lesin en la cabeza. Tiene tinnitus y: [employer]. Nuseas y vmitos. Prdida del equilibrio. Dolor de cabeza repentino e intenso. Cambios en la vista. Debilidad en la cara, los brazos o las piernas. Estos sntomas pueden Customer service manager. Solicite ayuda de inmediato. Llame al 911. No espere a ver si los sntomas desaparecen. No conduzca por sus propios medios [employer]. Esta informacin no tiene Theme park manager el consejo del mdico. Asegrese de hacerle al mdico cualquier pregunta que tenga. Document Revised: 03/11/2023 Document Reviewed: 03/11/2023 Elsevier Patient Education  2024 [employer].

## 2024-02-23 NOTE — Assessment & Plan Note (Signed)
 Normal examination. Starting to affect hearing and affecting quality of life Recommend ENT evaluation Referral placed today

## 2024-02-23 NOTE — Assessment & Plan Note (Signed)
 Sustained during trip and fall 2 weeks ago Healing well.  No signs of infection. May have fractured left big toe during the fall.  Does not want to do x-ray.

## 2024-02-23 NOTE — Assessment & Plan Note (Signed)
Clinically euthyroid.  TSH done today. Continue levothyroxine 50 mcg daily. 

## 2024-02-23 NOTE — Assessment & Plan Note (Signed)
 Intolerant to statins.  Statin myopathy Requesting prescription for omega threes Omega-3 acid ethyl esters 1 g twice a day recommended New prescription for Lovaza sent to pharmacy of record today.

## 2024-02-23 NOTE — Assessment & Plan Note (Signed)
 BP Readings from Last 3 Encounters:  02/23/24 130/78  01/05/24 124/70  12/20/23 (!) 146/88  Continue losartan 50 mg daily Well-controlled hypertension

## 2024-02-24 DIAGNOSIS — H1013 Acute atopic conjunctivitis, bilateral: Secondary | ICD-10-CM | POA: Diagnosis not present

## 2024-02-24 DIAGNOSIS — H43813 Vitreous degeneration, bilateral: Secondary | ICD-10-CM | POA: Diagnosis not present

## 2024-02-24 DIAGNOSIS — H2513 Age-related nuclear cataract, bilateral: Secondary | ICD-10-CM | POA: Diagnosis not present

## 2024-02-28 ENCOUNTER — Other Ambulatory Visit: Payer: Self-pay | Admitting: Emergency Medicine

## 2024-02-28 NOTE — Telephone Encounter (Unsigned)
 Copied from CRM [id number]. Topic: Clinical - Medication Refill >> Feb 28, 2024  3:44 PM Adonis Hoot wrote: Most Recent Primary Care Visit:  Provider: Elvira Hammersmith  Department: [facility]  Visit Type: ACUTE  Date: 02/23/2024  Medication: FLUoxetine  (PROZAC ) 40 MG capsule levothyroxine  (SYNTHROID ) 50 MCG tablet metFORMIN  (GLUCOPHAGE ) 500 MG tablet pantoprazole  (PROTONIX ) 40 MG tablet  Enalapril 40 mg  Has the patient contacted their pharmacy? Yes (Agent: If no, request that the patient contact the pharmacy for the refill. If patient does not wish to contact the pharmacy document the reason why and proceed with request.) (Agent: If yes, when and what did the pharmacy advise?)  Is this the correct pharmacy for this prescription? Yes If no, delete pharmacy and type the correct one.  This is the patient's preferred pharmacy:  [id number] [place], [place] - [phone] AT [facility] OF [facility] & [address] [place] [address] Phone: [phone] Fax: [phone]    Has the prescription been filled recently? No  Is the patient out of the medication? Yes  Has the patient been seen for an appointment in the last year OR does the patient have an upcoming appointment? Yes  Can we respond through MyChart? No  Agent: Please be advised that Rx refills may take up to 3 business days. We ask that you follow-up with your pharmacy.

## 2024-02-29 NOTE — Telephone Encounter (Signed)
 Patient is also requesting Enalapril 40 mg

## 2024-03-02 ENCOUNTER — Ambulatory Visit: Payer: PRIVATE HEALTH INSURANCE | Admitting: Gastroenterology

## 2024-03-02 MED ORDER — METFORMIN HCL 500 MG PO TABS: 500.0000 mg | ORAL_TABLET | Freq: Two times a day (BID) | ORAL | 1 refills | Status: AC

## 2024-03-02 MED ORDER — PANTOPRAZOLE SODIUM 40 MG PO TBEC: 40.0000 mg | DELAYED_RELEASE_TABLET | Freq: Every day | ORAL | 1 refills | Status: DC

## 2024-03-02 MED ORDER — FLUOXETINE HCL 40 MG PO CAPS
40.0000 mg | ORAL_CAPSULE | Freq: Every day | ORAL | 1 refills | Status: AC | PRN
Start: 2024-03-02 — End: ?

## 2024-03-02 MED ORDER — LEVOTHYROXINE SODIUM 50 MCG PO TABS: 50.0000 ug | ORAL_TABLET | Freq: Every day | ORAL | 1 refills | Status: DC

## 2024-03-13 ENCOUNTER — Ambulatory Visit (INDEPENDENT_AMBULATORY_CARE_PROVIDER_SITE_OTHER)

## 2024-03-13 ENCOUNTER — Encounter (INDEPENDENT_AMBULATORY_CARE_PROVIDER_SITE_OTHER): Payer: Self-pay

## 2024-03-13 VITALS — BP 133/85 | HR 70 | Ht 65.0 in | Wt 180.0 lb

## 2024-03-13 DIAGNOSIS — J310 Chronic rhinitis: Secondary | ICD-10-CM

## 2024-03-13 DIAGNOSIS — H9311 Tinnitus, right ear: Secondary | ICD-10-CM

## 2024-03-13 DIAGNOSIS — R0981 Nasal congestion: Secondary | ICD-10-CM

## 2024-03-14 DIAGNOSIS — J310 Chronic rhinitis: Secondary | ICD-10-CM | POA: Insufficient documentation

## 2024-03-14 NOTE — Progress Notes (Signed)
 CC: Right ear tinnitus  HPI:  Misty Pruitt is a 68 y.o. female who presents today complaining of right ear tinnitus.  She has been symptomatic for more than 1 year.  She describes the tinnitus as a constant high-pitched ringing noise.  It is nonpulsatile.  Her tinnitus has worsened after her recent fall, when she hit her head on the ground.  The patient has a history of environmental allergies.  She uses over-the-counter allergy medications.  She has no previous ENT surgery.  Currently she denies any otalgia, otorrhea, or vertigo.  Past Medical History:  Diagnosis Date   Hypertension    Thyroid  disease     Past Surgical History:  Procedure Laterality Date   HYSTERECTOMY ABDOMINAL WITH SALPINGECTOMY      Family History  Problem Relation Age of Onset   Diabetes Mother    Hypertension Mother    Diabetes Mellitus II Neg Hx    Colon cancer Neg Hx    Stomach cancer Neg Hx    Esophageal cancer Neg Hx     Social History:  reports that she has never smoked. She has never used smokeless tobacco. She reports that she does not drink alcohol. No history on file for drug use.  Allergies: No Known Allergies  Prior to Admission medications   Medication Sig Start Date End Date Taking? Authorizing Provider  aspirin  EC 81 MG tablet Take 81 mg by mouth 3 (three) times a week.   Yes [provider]  b complex vitamins tablet Take 1 tablet by mouth daily with breakfast.   Yes [provider]  CALCIUM  PO Take 1 tablet by mouth daily with breakfast.   Yes [provider]  cetirizine (ZYRTEC) 10 MG tablet Take 10 mg by mouth daily as needed for allergies or rhinitis (or "seasonal allergies").   Yes [provider]  Cholecalciferol (VITAMIN D-3 PO) Take 1 capsule by mouth daily with breakfast.   Yes [provider]  dexamethasone  (DECADRON ) 6 MG tablet Take 1 tablet (6 mg total) by mouth daily. 02/21/20  Yes Ghimire, Estil Heman, MD  FLUoxetine  (PROZAC ) 40 MG  capsule Take 1 capsule (40 mg total) by mouth daily as needed (for mood). 03/02/24  Yes Sagardia, Miguel Jose, MD  fluticasone (FLONASE) 50 MCG/ACT nasal spray Place 1-2 sprays into both nostrils daily as needed for allergies or rhinitis (or "seasonal allergies").   Yes [provider]  levothyroxine  (SYNTHROID ) 50 MCG tablet Take 1 tablet (50 mcg total) by mouth daily before breakfast. 03/02/24  Yes Sagardia, Isidro Margo, MD  losartan (COZAAR) 50 MG tablet Take 50 mg by mouth daily.   Yes [provider]  metFORMIN  (GLUCOPHAGE ) 500 MG tablet Take 1 tablet (500 mg total) by mouth 2 (two) times daily with a meal. 03/02/24  Yes Sagardia, Isidro Margo, MD  multivitamin-lutein (OCUVITE-LUTEIN) CAPS capsule Take 1 capsule by mouth daily.   Yes [provider]  omega-3 acid ethyl esters (LOVAZA ) 1 g capsule Take 1 capsule (1 g total) by mouth 2 (two) times daily. 02/23/24  Yes Sagardia, Isidro Margo, MD  pantoprazole  (PROTONIX ) 40 MG tablet Take 1 tablet (40 mg total) by mouth daily. 03/02/24  Yes Sagardia, Miguel Jose, MD  polyethylene glycol powder (GLYCOLAX /MIRALAX ) 17 GM/SCOOP powder Take 255 g by mouth daily. 01/05/24  Yes Daina Drum, MD  VITAMIN E PO Take 1 capsule by mouth daily with breakfast.   Yes [provider]    Blood pressure 133/85, pulse 70,  height 5\' 5"  (1.651 m), weight 180 lb (81.6 kg), SpO2 98%. Exam: General: Communicates without difficulty, well nourished, no acute distress. Head: Normocephalic, no evidence injury, no tenderness, facial buttresses intact without stepoff. Face/sinus: No tenderness to palpation and percussion. Facial movement is normal and symmetric. Eyes: PERRL, EOMI. No scleral icterus, conjunctivae clear. Neuro: CN II exam reveals vision grossly intact.  No nystagmus at any point of gaze. Ears: Auricles well formed without lesions.  Ear canals are intact without mass or lesion.  No erythema or edema is appreciated.  The TMs are intact  without fluid. Nose: External evaluation reveals normal support and skin without lesions.  Dorsum is intact.  Anterior rhinoscopy reveals congested mucosa over anterior aspect of inferior turbinates and intact septum.  No purulence noted. Oral:  Oral cavity and oropharynx are intact, symmetric, without erythema or edema.  Mucosa is moist without lesions. Neck: Full range of motion without pain.  There is no significant lymphadenopathy.  No masses palpable.  Thyroid  bed within normal limits to palpation.  Parotid glands and submandibular glands equal bilaterally without mass.  Trachea is midline. Neuro:  CN 2-12 grossly intact.   Assessment: 1.  Subjective right ear tinnitus. 2.  Her ear canals, tympanic membranes, and middle ear spaces are normal. 3.  Chronic/allergic rhinitis, with nasal mucosal congestion.  Plan: 1.  The physical exam findings are reviewed with the patient. 2.  The strategies to cope with tinnitus, including the use of masker, hearing aids, tinnitus retraining therapy, and avoidance of caffeine and alcohol are discussed. 3.  Outpatient hearing test.  No audiologist is available today. 4.  If asymmetric hearing loss is noted, the patient will benefit from an MRI scan to rule out any retrocochlear lesion.  Doneisha Ivey W Quadir Muns 03/14/2024, 10:36 AM

## 2024-03-23 ENCOUNTER — Ambulatory Visit (INDEPENDENT_AMBULATORY_CARE_PROVIDER_SITE_OTHER): Admitting: Audiology

## 2024-03-23 DIAGNOSIS — H9041 Sensorineural hearing loss, unilateral, right ear, with unrestricted hearing on the contralateral side: Secondary | ICD-10-CM | POA: Diagnosis not present

## 2024-03-23 NOTE — Progress Notes (Signed)
  [address], [address] [place], [place] [address] [phone]  Audiological Evaluation    Name: Misty Pruitt     DOB:   1956-08-23      MRN:   [id number]                                                                                     Service Date: 03/23/2024     Accompanied by: unaccompanied   Patient comes today after Dr. Darlin Ehrlich, ENT sent a referral for a hearing evaluation due to concerns with tinnitus.   Symptoms Yes Details  Hearing loss  [x]  Right ear with onset this year (2025)  Tinnitus  [x]  Right ear , constant and with onset last year ( 2024) - reports to be a static like sound and it affects her sleep. She turns on the radio to help her sleep.  Ear pain/ infections/pressure  []    Balance problems  [x]  Off balance sensation if she closes her eyes while in the shower.  Noise exposure history  [x]  Denied - except when she used headphone when she worked for [employer]  Previous ear surgeries  []    Family history of hearing loss  []    Amplification  []    Other  []      Otoscopy: Right ear: Clear external ear canals and notable landmarks visualized on the tympanic membrane. Left ear:  Clear external ear canals and notable landmarks visualized on the tympanic membrane.  Tympanometry: Right ear: Type A- Normal external ear canal volume with normal middle ear pressure and tympanic membrane compliance. Left ear: Type A- Normal external ear canal volume with normal middle ear pressure and tympanic membrane compliance.    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    Left ear-  Normal hearing from [phone] Hz.  Speech Audiometry: Right ear- Speech Reception Threshold (SRT) was obtained at 10 dBHL. Left ear-Speech Reception Threshold (SRT) was obtained at 10 dBHL.   Word Recognition Score Tested using  (MLV)- Spanish List Right ear: 100% was obtained at a presentation level of 55  dBHL with contralateral masking which is deemed as  excellent. Left ear: 100% was obtained at a presentation level of 55 dBHL with contralateral masking which is deemed as  excellent.   The hearing test results were completed under headphones and re-checked with inserts and results are deemed to be of good reliability. Test technique:  conventional    Impression: There is a difference in thresholds at 4000 Hz, worse in the right ear.  Recommendations: Follow up with ENT as per MD. Return for a hearing evaluation if concerns with hearing changes arise or per MD recommendation. Consider various tinnitus strategies, including the use of a sound generator, amplification (after medical clearance), and/or tinnitus retraining therapy. Patient was recommended to consider [facility] tinnitus and sound sensitivity clinic.    [name] [name], AUD

## 2024-03-27 ENCOUNTER — Other Ambulatory Visit (INDEPENDENT_AMBULATORY_CARE_PROVIDER_SITE_OTHER): Payer: Self-pay | Admitting: Otolaryngology

## 2024-03-27 ENCOUNTER — Ambulatory Visit: Payer: Self-pay

## 2024-03-27 ENCOUNTER — Other Ambulatory Visit: Payer: Self-pay | Admitting: Emergency Medicine

## 2024-03-27 DIAGNOSIS — H9311 Tinnitus, right ear: Secondary | ICD-10-CM

## 2024-03-27 DIAGNOSIS — H9041 Sensorineural hearing loss, unilateral, right ear, with unrestricted hearing on the contralateral side: Secondary | ICD-10-CM

## 2024-03-27 MED ORDER — ENALAPRIL MALEATE 20 MG PO TABS: 20.0000 mg | ORAL_TABLET | Freq: Two times a day (BID) | ORAL | 1 refills | Status: AC

## 2024-03-27 NOTE — Progress Notes (Unsigned)
 The patient's hearing test on May 15 is reviewed.  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  Her results are reviewed over the phone with the patient.  Will order an MRI scan to evaluate for retrocochlear lesion.

## 2024-03-27 NOTE — Telephone Encounter (Signed)
 Copied from CRM [id number]. Topic: Clinical - Medication Question >> Mar 27, 2024 10:03 AM [name] wrote: Reason for CRM: PT CALLED STATED THE losartan (COZAAR) 50 MG tablet IS MAKING HER FACE SWELL  AND SHE WOULD LIKE TO BE PRESCRIBED ENALAPRIL  BECAUSE IT WORKS FOR HER

## 2024-03-27 NOTE — Telephone Encounter (Signed)
 Pt called today stating she needs a refill on her enalapril  40 mg medication: states she has been taking this medication for 20 years and is out of the medication.  Pt stated one time they but her on losartan (COZAAR) but she had to stop taking the medication because it caused her face to swell.  Pt would like a 3 month supply of enalapril  because she plans on doing some traveling. Reason for Disposition . Health Information question, no triage required and triager able to answer question  Protocols used: Information Only Call - No Triage-A-AH

## 2024-03-27 NOTE — Telephone Encounter (Signed)
 Was speaking with pt and she said someone was calling her and disconnected.

## 2024-03-27 NOTE — Telephone Encounter (Signed)
 Call patient and find out more about enalapril .  It does not come in 40 mg tablets.

## 2024-04-02 ENCOUNTER — Ambulatory Visit (HOSPITAL_COMMUNITY)
Admission: RE | Admit: 2024-04-02 | Discharge: 2024-04-02 | Disposition: A | Discharge status: Home / Self Care | Source: Ambulatory Visit | Attending: Otolaryngology | Admitting: Otolaryngology

## 2024-04-02 DIAGNOSIS — I6782 Cerebral ischemia: Secondary | ICD-10-CM | POA: Diagnosis not present

## 2024-04-02 DIAGNOSIS — H9311 Tinnitus, right ear: Secondary | ICD-10-CM | POA: Insufficient documentation

## 2024-04-02 DIAGNOSIS — H9041 Sensorineural hearing loss, unilateral, right ear, with unrestricted hearing on the contralateral side: Secondary | ICD-10-CM | POA: Insufficient documentation

## 2024-04-02 MED ORDER — GADOBUTROL 1 MMOL/ML IV SOLN
8.0000 mL | Freq: Once | INTRAVENOUS | Status: AC | PRN
  Administered 2024-04-02: 8 mL via INTRAVENOUS

## 2024-04-04 ENCOUNTER — Encounter: Payer: Self-pay | Admitting: Audiology

## 2024-04-14 DIAGNOSIS — R350 Frequency of micturition: Secondary | ICD-10-CM | POA: Diagnosis not present

## 2024-05-22 ENCOUNTER — Other Ambulatory Visit: Payer: Self-pay | Admitting: Emergency Medicine

## 2024-06-27 DIAGNOSIS — R7303 Prediabetes: Secondary | ICD-10-CM | POA: Diagnosis not present

## 2024-06-27 DIAGNOSIS — E785 Hyperlipidemia, unspecified: Secondary | ICD-10-CM | POA: Diagnosis not present

## 2024-06-27 DIAGNOSIS — L299 Pruritus, unspecified: Secondary | ICD-10-CM | POA: Diagnosis not present

## 2024-06-27 DIAGNOSIS — E039 Hypothyroidism, unspecified: Secondary | ICD-10-CM | POA: Diagnosis not present

## 2024-06-27 DIAGNOSIS — R140 Abdominal distension (gaseous): Secondary | ICD-10-CM | POA: Diagnosis not present

## 2024-06-27 DIAGNOSIS — R198 Other specified symptoms and signs involving the digestive system and abdomen: Secondary | ICD-10-CM | POA: Diagnosis not present

## 2024-06-27 DIAGNOSIS — L508 Other urticaria: Secondary | ICD-10-CM | POA: Diagnosis not present

## 2024-07-12 ENCOUNTER — Ambulatory Visit

## 2024-07-16 ENCOUNTER — Ambulatory Visit (HOSPITAL_COMMUNITY)
Admission: EM | Admit: 2024-07-16 | Discharge: 2024-07-16 | Disposition: A | Discharge status: Home / Self Care | Attending: Physician Assistant | Admitting: Physician Assistant

## 2024-07-16 ENCOUNTER — Encounter (HOSPITAL_COMMUNITY): Payer: Self-pay

## 2024-07-16 DIAGNOSIS — L282 Other prurigo: Secondary | ICD-10-CM | POA: Diagnosis not present

## 2024-07-16 DIAGNOSIS — L237 Allergic contact dermatitis due to plants, except food: Secondary | ICD-10-CM

## 2024-07-16 MED ORDER — LEVOCETIRIZINE DIHYDROCHLORIDE 5 MG PO TABS
5.0000 mg | ORAL_TABLET | Freq: Every evening | ORAL | 0 refills | Status: AC
Start: 2024-07-16 — End: ?

## 2024-07-16 MED ORDER — PREDNISONE 10 MG (21) PO TBPK
ORAL_TABLET | ORAL | 0 refills | Status: AC
Start: 2024-07-16 — End: ?

## 2024-07-16 MED ORDER — TRIAMCINOLONE ACETONIDE 0.1 % EX CREA: 1.0000 | TOPICAL_CREAM | Freq: Two times a day (BID) | CUTANEOUS | 0 refills | Status: AC

## 2024-07-16 NOTE — ED Triage Notes (Signed)
 Pt states that she has a rash all over her body. X1 week  Pt states that the rash itches and is painful. Pt states that she has use otc creams.

## 2024-07-16 NOTE — Discharge Instructions (Signed)
 Take prednisone  as prescribed.  Do not take NSAIDs with this medication including aspirin , ibuprofen/Advil, naproxen/Aleve.  You can use Tylenol  if needed.  Keep the area clean with soap and water.  If you have any signs of infection including pain, fever, spread of rash, additional redness need to be seen immediately.  You can apply triamcinolone  up to twice a day on specific areas that are bothersome.  Take Xyzal  at night to help with itching.  This replaces your cetirizine or other over-the-counter antihistamines.  If anything worsens please return for reevaluation.

## 2024-07-16 NOTE — ED Provider Notes (Signed)
 [facility]    CSN: [id number] Arrival date & time: 07/16/24  1636      History   Chief Complaint Chief Complaint  Patient presents with   Rash    HPI BRYSTAL KILDOW is a 68 y.o. female.   Patient presents today with a 1 week history of widespread.  Rash.  Reports that this began on her arms after she was working in the yard and she assumed that it was poison ivy but then it has started to spread and is now involving her neck and groin.  She reports a burning sensation in addition to significant pruritus.  She has tried over-the-counter medications including calamine lotion as well as an over-the-counter cream that she brought from [place] but these have been ineffective.  She denies any shortness of breath, swelling of her throat, nausea, vomiting, diarrhea.  Denies any additional new exposures including to plants, insects, animals, changes to personal hygiene products.  She denies history of diabetes.  She did have a leftover prescription of prednisone  and took one 20 mg tablet that provided some relief of symptoms.    Past Medical History:  Diagnosis Date   Hypertension    Thyroid  disease     Patient Active Problem List   Diagnosis Date Noted   Chronic rhinitis 03/14/2024   Dyslipidemia 02/23/2024   Statin intolerance 02/23/2024   Urinary incontinence 02/23/2024   Multiple abrasions 02/23/2024   Right ear pain 02/23/2024   Chronic left shoulder pain 12/16/2023   Chronic pain of both hips 07/28/2023   Chronic constipation 07/28/2023   Right-sided tinnitus 07/28/2023   Essential hypertension 02/16/2020   Hypothyroidism 02/16/2020    Past Surgical History:  Procedure Laterality Date   HYSTERECTOMY ABDOMINAL WITH SALPINGECTOMY      OB History   No obstetric history on file.      Home Medications    Prior to Admission medications   Medication Sig Start Date End Date Taking? Authorizing Provider  aspirin  EC 81 MG tablet Take 81 mg by mouth 3 (three)  times a week.   Yes [provider]  b complex vitamins tablet Take 1 tablet by mouth daily with breakfast.   Yes [provider]  CALCIUM  PO Take 1 tablet by mouth daily with breakfast.   Yes [provider]  cetirizine (ZYRTEC) 10 MG tablet Take 10 mg by mouth daily as needed for allergies or rhinitis (or seasonal allergies).   Yes [provider]  Cholecalciferol (VITAMIN D-3 PO) Take 1 capsule by mouth daily with breakfast.   Yes [provider]  enalapril  (VASOTEC ) 20 MG tablet Take 1 tablet (20 mg total) by mouth 2 (two) times daily. 03/27/24  Yes Sagardia, Emil Schanz, MD  FLUoxetine  (PROZAC ) 40 MG capsule Take 1 capsule (40 mg total) by mouth daily as needed (for mood). 03/02/24  Yes Sagardia, Miguel Jose, MD  fluticasone (FLONASE) 50 MCG/ACT nasal spray Place 1-2 sprays into both nostrils daily as needed for allergies or rhinitis (or seasonal allergies).   Yes [provider]  levocetirizine (XYZAL ) 5 MG tablet Take 1 tablet (5 mg total) by mouth every evening. 07/16/24  Yes Starletta Houchin K, PA-C  levothyroxine  (SYNTHROID ) 50 MCG tablet TAKE 1 TABLET(50 MCG) BY MOUTH DAILY BEFORE BREAKFAST 05/22/24  Yes Sagardia, Emil Schanz, MD  metFORMIN  (GLUCOPHAGE ) 500 MG tablet Take 1 tablet (500 mg total) by mouth 2 (two) times daily with a meal. 03/02/24  Yes Sagardia, Emil Schanz, MD  multivitamin-lutein (  OCUVITE-LUTEIN) CAPS capsule Take 1 capsule by mouth daily.   Yes [provider]  omega-3 acid ethyl esters (LOVAZA ) 1 g capsule Take 1 capsule (1 g total) by mouth 2 (two) times daily. 02/23/24  Yes Sagardia, Emil Schanz, MD  pantoprazole  (PROTONIX ) 40 MG tablet TAKE 1 TABLET(40 MG) BY MOUTH DAILY 05/22/24  Yes Sagardia, Emil Schanz, MD  polyethylene glycol powder (GLYCOLAX /MIRALAX ) 17 GM/SCOOP powder Take 255 g by mouth daily. 01/05/24  Yes Federico Rosario BROCKS, MD  predniSONE  (STERAPRED UNI-PAK 21 TAB) 10 MG (21) TBPK tablet Take 40 mg (4  tablets) days 1 and 2, take 30 mg (3 tablets) days 3 and 4, take 20 mg (2 tablets) days 5 and 6, take 10 mg (1 tablet) days 7 and 8, take 5 mg (0.5 tablets) days 9 and 10, then stop. 07/16/24  Yes Noris Kulinski K, PA-C  triamcinolone  cream (KENALOG ) 0.1 % Apply 1 Application topically 2 (two) times daily. 07/16/24  Yes Llewellyn Choplin K, PA-C  VITAMIN E PO Take 1 capsule by mouth daily with breakfast.   Yes [provider]    Family History Family History  Problem Relation Age of Onset   Diabetes Mother    Hypertension Mother    Diabetes Mellitus II Neg Hx    Colon cancer Neg Hx    Stomach cancer Neg Hx    Esophageal cancer Neg Hx     Social History Social History   Tobacco Use   Smoking status: Never   Smokeless tobacco: Never  Vaping Use   Vaping status: Never Used  Substance Use Topics   Alcohol use: Never     Allergies   Patient has no known allergies.   Review of Systems Review of Systems  Constitutional:  Positive for activity change. Negative for appetite change, fatigue and fever.  HENT:  Negative for trouble swallowing and voice change.   Respiratory:  Negative for chest tightness, shortness of breath and wheezing.   Cardiovascular:  Negative for chest pain.  Gastrointestinal:  Negative for abdominal pain, diarrhea, nausea and vomiting.  Skin:  Positive for rash.     Physical Exam Triage Vital Signs ED Triage Vitals  Encounter Vitals Group     BP 07/16/24 1732 (!) 156/85     Girls Systolic BP Percentile --      Girls Diastolic BP Percentile --      Boys Systolic BP Percentile --      Boys Diastolic BP Percentile --      Pulse Rate 07/16/24 1732 70     Resp 07/16/24 1732 17     Temp 07/16/24 1732 98.1 F (36.7 C)     Temp Source 07/16/24 1732 Oral     SpO2 07/16/24 1732 98 %     Weight 07/16/24 1729 180 lb (81.6 kg)     Height 07/16/24 1729 5' (1.524 m)     Head Circumference --      Peak Flow --      Pain Score 07/16/24 1729 4     Pain Loc --       Pain Education --      Exclude from Growth Chart --    No data found.  Updated Vital Signs BP (!) 156/85 (BP Location: Left Arm)   Pulse 70   Temp 98.1 F (36.7 C) (Oral)   Resp 17   Ht 5' (1.524 m)   Wt 180 lb (81.6 kg)   SpO2 98%   BMI 35.15 kg/m  Visual Acuity Right Eye Distance:   Left Eye Distance:   Bilateral Distance:    Right Eye Near:   Left Eye Near:    Bilateral Near:     Physical Exam Vitals reviewed.  Constitutional:      General: She is awake. She is not in acute distress.    Appearance: Normal appearance. She is well-developed. She is not ill-appearing.     Comments: Very pleasant female appears stated age in no acute distress sitting comfortably in exam room  HENT:     Head: Normocephalic and atraumatic.     Mouth/Throat:     Pharynx: Uvula midline. No oropharyngeal exudate or posterior oropharyngeal erythema.  Cardiovascular:     Rate and Rhythm: Normal rate and regular rhythm.     Heart sounds: Normal heart sounds, S1 normal and S2 normal. No murmur heard. Pulmonary:     Effort: Pulmonary effort is normal.     Breath sounds: Normal breath sounds. No wheezing, rhonchi or rales.     Comments: Clear to auscultation bilaterally Skin:    Findings: Rash present. Rash is macular, papular and vesicular.     Comments: Widespread maculopapular rash with several fine vesicles noted left decubital space.  Psychiatric:        Behavior: Behavior is cooperative.      UC Treatments / Results  Labs (all labs ordered are listed, but only abnormal results are displayed) Labs Reviewed - No data to display  EKG   Radiology No results found.  Procedures Procedures (including critical care time)  Medications Ordered in UC Medications - No data to display  Initial Impression / Assessment and Plan / UC Course  I have reviewed the triage vital signs and the nursing notes.  Pertinent labs & imaging results that were available during my care of the  patient were reviewed by me and considered in my medical decision making (see chart for details).     Patient is well-appearing, afebrile, nontoxic, nontachycardic.  Symptoms are consistent with contact dermatitis.  Will start prednisone  taper and we discussed that she is not to combine this with NSAIDs due to risk of GI bleeding.  She is to keep the area clean and return with any signs of secondary infection including redness, swelling, drainage, fever, increasing pain.  Will start Xyzal  to help manage pruritus.  No indication for dose adjustment based on metabolic panel from 01/06/2024 with a creatinine of 0.72 and calculated creatinine clearance of 99 mL liters per minute.  We discussed that if anything worsens or changes she needs to be seen immediately.  Strict return precautions given.  All questions answered to patient's satisfaction.  Final Clinical Impressions(s) / UC Diagnoses   Final diagnoses:  Allergic contact dermatitis due to plants, except food  Pruritic rash     Discharge Instructions      Take prednisone  as prescribed.  Do not take NSAIDs with this medication including aspirin , ibuprofen/Advil, naproxen/Aleve.  You can use Tylenol  if needed.  Keep the area clean with soap and water.  If you have any signs of infection including pain, fever, spread of rash, additional redness need to be seen immediately.  You can apply triamcinolone  up to twice a day on specific areas that are bothersome.  Take Xyzal  at night to help with itching.  This replaces your cetirizine or other over-the-counter antihistamines.  If anything worsens please return for reevaluation.     ED Prescriptions     Medication Sig Dispense Auth. Provider  predniSONE  (STERAPRED UNI-PAK 21 TAB) 10 MG (21) TBPK tablet Take 40 mg (4 tablets) days 1 and 2, take 30 mg (3 tablets) days 3 and 4, take 20 mg (2 tablets) days 5 and 6, take 10 mg (1 tablet) days 7 and 8, take 5 mg (0.5 tablets) days 9 and 10, then stop. 21  tablet Shawndra Clute K, PA-C   levocetirizine (XYZAL ) 5 MG tablet Take 1 tablet (5 mg total) by mouth every evening. 10 tablet Luccia Reinheimer K, PA-C   triamcinolone  cream (KENALOG ) 0.1 % Apply 1 Application topically 2 (two) times daily. 30 g Mikyah Alamo K, PA-C      PDMP not reviewed this encounter.   Sherrell Rocky POUR, PA-C 07/16/24 1802

## 2024-07-18 DIAGNOSIS — L237 Allergic contact dermatitis due to plants, except food: Secondary | ICD-10-CM | POA: Diagnosis not present

## 2024-07-18 DIAGNOSIS — B372 Candidiasis of skin and nail: Secondary | ICD-10-CM | POA: Diagnosis not present

## 2024-08-08 ENCOUNTER — Telehealth: Payer: Self-pay | Admitting: Emergency Medicine

## 2024-08-08 NOTE — Telephone Encounter (Signed)
 Contacted [facility] to schedule their annual wellness visit. Patient declined to schedule AWV at this time. Patient stated she has changed clinics and hung up on me.   [facility] Care Guide [facility] AWV TEAM Direct Dial: [phone]

## 2024-08-16 DIAGNOSIS — E781 Pure hyperglyceridemia: Secondary | ICD-10-CM | POA: Diagnosis not present

## 2024-08-16 DIAGNOSIS — I10 Essential (primary) hypertension: Secondary | ICD-10-CM | POA: Diagnosis not present

## 2024-08-16 DIAGNOSIS — E785 Hyperlipidemia, unspecified: Secondary | ICD-10-CM | POA: Diagnosis not present

## 2024-08-16 DIAGNOSIS — E039 Hypothyroidism, unspecified: Secondary | ICD-10-CM | POA: Diagnosis not present

## 2024-08-16 DIAGNOSIS — K219 Gastro-esophageal reflux disease without esophagitis: Secondary | ICD-10-CM | POA: Diagnosis not present
